# Patient Record
Sex: Female | Born: 1985 | Race: White | Hispanic: No | Marital: Married | Smoking: Never smoker
Health system: Southern US, Community
[De-identification: ages and names within clinical notes are randomized; demographics above are authoritative.]

## PROBLEM LIST (undated history)

## (undated) DIAGNOSIS — R7303 Prediabetes: Secondary | ICD-10-CM

## (undated) DIAGNOSIS — E039 Hypothyroidism, unspecified: Secondary | ICD-10-CM

## (undated) DIAGNOSIS — Z148 Genetic carrier of other disease: Secondary | ICD-10-CM

## (undated) DIAGNOSIS — E282 Polycystic ovarian syndrome: Secondary | ICD-10-CM

## (undated) DIAGNOSIS — J45909 Unspecified asthma, uncomplicated: Secondary | ICD-10-CM

## (undated) HISTORY — DX: Polycystic ovarian syndrome: E28.2

## (undated) HISTORY — PX: WISDOM TOOTH EXTRACTION: SHX21

## (undated) HISTORY — DX: Unspecified asthma, uncomplicated: J45.909

## (undated) HISTORY — DX: Prediabetes: R73.03

---

## 2014-02-26 ENCOUNTER — Other Ambulatory Visit (HOSPITAL_COMMUNITY): Payer: Self-pay

## 2014-02-26 ENCOUNTER — Other Ambulatory Visit (HOSPITAL_COMMUNITY): Payer: Self-pay | Admitting: Maternal and Fetal Medicine

## 2014-02-26 ENCOUNTER — Ambulatory Visit (HOSPITAL_COMMUNITY)
Admission: RE | Admit: 2014-02-26 | Discharge: 2014-02-26 | Disposition: A | Discharge status: Home / Self Care | Payer: PRIVATE HEALTH INSURANCE | Source: Ambulatory Visit | Attending: Obstetrics and Gynecology | Admitting: Obstetrics and Gynecology

## 2014-02-26 ENCOUNTER — Encounter (HOSPITAL_COMMUNITY): Payer: Self-pay

## 2014-02-26 DIAGNOSIS — Z3A12 12 weeks gestation of pregnancy: Secondary | ICD-10-CM

## 2014-02-26 DIAGNOSIS — IMO0002 Reserved for concepts with insufficient information to code with codable children: Secondary | ICD-10-CM

## 2014-02-26 DIAGNOSIS — Z0489 Encounter for examination and observation for other specified reasons: Secondary | ICD-10-CM

## 2014-02-26 DIAGNOSIS — Z315 Encounter for genetic counseling: Secondary | ICD-10-CM | POA: Diagnosis not present

## 2014-02-26 DIAGNOSIS — O289 Unspecified abnormal findings on antenatal screening of mother: Secondary | ICD-10-CM

## 2014-02-26 NOTE — Progress Notes (Signed)
Genetic Counseling  High-Risk Gestation Note  Appointment Date:  02/26/2014 Referred By: Lennox SoldersKulish, Christine E, DO Date of Birth:  [birth year] Partner:  Shari HeritageSteven Gerdeman   Pregnancy History: G1P0 Estimated Date of Delivery: 09/05/14 Estimated Gestational Age: 6458w6d Attending: Particia Nearingecker, Martha, MD  Mrs. Zella BallDeanna Whittington and her husband, Mr. Shari HeritageSteven Ipock, were seen for genetic counseling because of an increased risk for fetal Down syndrome based on Sequential 1 screening.  They were counseled regarding the Sequential 1 (First trimester) screening result and the associated 1 in 15 risk for fetal Down syndrome.  We reviewed chromosomes, nondisjunction, and the common features and variable prognosis of Down syndrome.  In addition, we reviewed the screen adjusted risk for trisomy 7818.  We also discussed other explanations for a screen positive result including: differences in maternal metabolism and normal variation. They understand that this screening is not diagnostic for Down syndrome, but provides a risk assessment.  In addition, we discussed that the maternal serum PAPP-A value (0.23 MoMs) was atypically low for the gestational age.  The were counseled that a low MSPAPP-A is associated with an increased risk for adverse outcomes including: PIH, preeclampsia, poor fetal growth, and preterm delivery. We discussed the recommendation for increased surveillance.   We reviewed available screening options including noninvasive prenatal screening (NIPS)/cell free fetal DNA (cffDNA) testing, and detailed ultrasound.  They were counseled that screening tests are used to modify a patient's a priori risk for aneuploidy, typically based on age. This estimate provides a pregnancy specific risk assessment. We reviewed the benefits and limitations of each option. Specifically, we discussed the conditions for which each test screens, the detection rates, and false positive rates of each. They were also counseled regarding  diagnostic testing via CVS/amniocentesis. We reviewed the associated risks for complications, including spontaneous pregnancy loss.  After consideration of all the options, they elected to proceed with NIPS (Panorama).  Those results will be available in 8-10 days.  The patient also expressed interest in returning for a detailed ultrasound.  This appointment was scheduled today.  Diagnostic testing (CVS/amniocentesis) was declined today.  They understand that screening tests cannot rule out all birth defects or genetic syndromes. The patient was advised of this limitation and states she still does not want additional testing at this time.   Mrs. Flanery was provided with written information regarding cystic fibrosis (CF) including the carrier frequency and incidence in the Caucasian population, the availability of carrier testing and prenatal diagnosis if indicated.  In addition, we discussed that CF is routinely screened for as part of the [place] newborn screening panel.  She declined testing today.   Both family histories were reviewed and found to be contributory for Mrs. Kollmann's father having hereditary hemochromatosis. They were counseled that hemochromatosis, also referred to as HFE-associated hereditary hemochromatosis (HFE-HH), is a common genetic condition that results when a person has gene alterations in both copies of their HFE gene.  This leads to an increased amount of iron absorption and storage of iron in the body including the skin, liver, pancreas, heart, joints and testes.  Symptoms may include lethargy, weight loss, weakness or abdominal pain and may progress to increased skin pigmentation, diabetes mellitus, liver cirrhosis, arthritis or congestive heart failure if untreated.  Symptoms in females are usually milder and occur later in life (post-menopause).  This condition is typically treated via phlebotomy. Variable expressivity and reduced penetrance have been reported, with individuals  who present with only increased transferrin-iron saturation and increased serum ferritin concentration  as well as individuals who are homozygous for C282Y/C282Y who do not have iron overload nor clinical manifestations. The diagnosis of clinical hemochromatosis in individuals is typically based on finding elevated transferrin-iron saturation 45% or higher and serum ferritin concentration above the upper limit of normal and finding two HFE-HH-causing mutations via molecular genetic testing.   We reviewed genes and the autosomal recessive inheritance of hemochromatosis, meaning that each child of a carrier couple has an independent 25% (1 in 4) risk to inherit the condition.  Carriers have no symptoms of the disease and males and females are affected equally. We discussed that all offspring of an individual with hemochromatosis would be obligate carriers. Thus, Mrs. Lelon MastRightmyer would be an obligate carrier of hemochromatosis.  Approximately 1 in 9 Caucasians is a carrier for hemochromatosis. We discussed that genetic testing would not be required for Mrs. Mcgahee, given that she would be expected to be a carrier, given that her father is homozygous. However, given the high carrier frequency, reduced penetrance, genetic testing would be available to determine if she could also be homozygous, which is a less likely scenario.  Mr. Lelon MastRightmyer would have the general population chance to be a carrier of approximately 1 in 1029 given no known family history and no known consanguinity to Mrs. Pytel.  Thus, prior to testing for Mr. Lelon MastRightmyer, the risk of hemochromatosis for the current pregnancy is approximately 1 in 2036, assuming carrier status for Mrs. Vallecillo. We discussed that carrier testing would be available to Mr. Lelon MastRightmyer, if desired, and prenatal diagnosis via amniocentesis would be available for pregnancies of a couple who are both carriers. However, prenatal diagnosis for hemochromatosis is controversial given  the adult-onset nature of the condition as well as the available treatment and reduced penetrance of the condition. Mr. Lelon MastRightmyer declined testing today.  This couple expressed that they are not interested in prenatal diagnosis of hemochromatosis, given the risk for complications associated with the procedure.    Mrs. Pies denied exposure to environmental toxins or chemical agents. She denied the use of alcohol, tobacco or street drugs. She denied significant viral illnesses during the course of her pregnancy. Her medical and surgical histories were contributory for asthma.   I counseled this couple for approximately 43 minutes regarding the above risks and available options.   Donald Prosehristy S. Ladeana Laplant, MS  Certified Genetic Counselor

## 2014-03-05 ENCOUNTER — Telehealth (HOSPITAL_COMMUNITY): Payer: Self-pay | Admitting: Genetics

## 2014-03-05 ENCOUNTER — Other Ambulatory Visit (HOSPITAL_COMMUNITY): Payer: Self-pay

## 2014-03-05 NOTE — Telephone Encounter (Signed)
Called Mariselda Mcconaughey to discuss her cell free fetal DNA test results.  Mrs. Zella BallDeanna Stanfield had Panorama testing through [facility].  Testing was offered because of an abnormal sequential screen part 1.   The patient was identified by name and DOB.  We reviewed that these are within normal limits, showing a less than 1 in 10,000 risk for trisomies 21, 18 and 13, and monosomy X (Turner syndrome).  In addition, the risk for triploidy/vanishing twin and sex chromosome trisomies (47,XXX and 47,XXY) was also low risk.   We reviewed that this testing identifies > 99% of pregnancies with trisomy 8221, trisomy 3813, sex chromosome trisomies (47,XXX and 47,XXY), and triploidy. The detection rate for trisomy 18 is 96%.  The detection rate for monosomy X is ~92%.  The false positive rate is <0.1% for all conditions. Testing was also consistent with female fetal sex.  The patient did not wish to know fetal sex, but may call back later to find out.  She understands that this testing does not identify all genetic conditions.  All questions were answered to her satisfaction, she was encouraged to call with additional questions or concerns.  Azalia Bilisonrad,Jair Lindblad M, MS Certified Genetic Counselor

## 2014-04-02 ENCOUNTER — Other Ambulatory Visit (HOSPITAL_COMMUNITY): Payer: Self-pay | Admitting: Maternal and Fetal Medicine

## 2014-04-02 ENCOUNTER — Ambulatory Visit (HOSPITAL_COMMUNITY)
Admission: RE | Admit: 2014-04-02 | Discharge: 2014-04-02 | Disposition: A | Discharge status: Home / Self Care | Payer: PRIVATE HEALTH INSURANCE | Source: Ambulatory Visit | Attending: Maternal and Fetal Medicine | Admitting: Maternal and Fetal Medicine

## 2014-04-02 ENCOUNTER — Encounter (HOSPITAL_COMMUNITY): Payer: Self-pay

## 2014-04-02 DIAGNOSIS — O289 Unspecified abnormal findings on antenatal screening of mother: Secondary | ICD-10-CM | POA: Insufficient documentation

## 2014-04-02 DIAGNOSIS — IMO0002 Reserved for concepts with insufficient information to code with codable children: Secondary | ICD-10-CM

## 2014-04-02 DIAGNOSIS — E039 Hypothyroidism, unspecified: Secondary | ICD-10-CM | POA: Insufficient documentation

## 2014-04-02 DIAGNOSIS — Z36 Encounter for antenatal screening of mother: Secondary | ICD-10-CM | POA: Diagnosis not present

## 2014-04-02 DIAGNOSIS — Z3689 Encounter for other specified antenatal screening: Secondary | ICD-10-CM | POA: Insufficient documentation

## 2014-04-02 DIAGNOSIS — Z0489 Encounter for examination and observation for other specified reasons: Secondary | ICD-10-CM

## 2014-04-02 DIAGNOSIS — O99282 Endocrine, nutritional and metabolic diseases complicating pregnancy, second trimester: Secondary | ICD-10-CM | POA: Diagnosis not present

## 2014-04-02 DIAGNOSIS — Z3A17 17 weeks gestation of pregnancy: Secondary | ICD-10-CM | POA: Insufficient documentation

## 2014-04-02 HISTORY — DX: Genetic carrier of other disease: Z14.8

## 2014-04-02 HISTORY — DX: Hypothyroidism, unspecified: E03.9

## 2014-05-14 ENCOUNTER — Ambulatory Visit (HOSPITAL_COMMUNITY)
Admission: RE | Admit: 2014-05-14 | Discharge: 2014-05-14 | Disposition: A | Discharge status: Home / Self Care | Payer: PRIVATE HEALTH INSURANCE | Source: Ambulatory Visit | Attending: Obstetrics and Gynecology | Admitting: Obstetrics and Gynecology

## 2014-05-14 ENCOUNTER — Encounter (HOSPITAL_COMMUNITY): Payer: Self-pay

## 2014-05-14 DIAGNOSIS — O283 Abnormal ultrasonic finding on antenatal screening of mother: Secondary | ICD-10-CM | POA: Insufficient documentation

## 2014-05-14 DIAGNOSIS — Z3A23 23 weeks gestation of pregnancy: Secondary | ICD-10-CM | POA: Insufficient documentation

## 2014-05-14 DIAGNOSIS — O289 Unspecified abnormal findings on antenatal screening of mother: Secondary | ICD-10-CM

## 2014-05-14 DIAGNOSIS — Z3A00 Weeks of gestation of pregnancy not specified: Secondary | ICD-10-CM | POA: Diagnosis not present

## 2014-06-25 ENCOUNTER — Encounter (HOSPITAL_COMMUNITY): Payer: Self-pay

## 2014-06-25 ENCOUNTER — Ambulatory Visit (HOSPITAL_COMMUNITY)
Admission: RE | Admit: 2014-06-25 | Discharge: 2014-06-25 | Disposition: A | Discharge status: Home / Self Care | Payer: PRIVATE HEALTH INSURANCE | Source: Ambulatory Visit | Attending: Obstetrics and Gynecology | Admitting: Obstetrics and Gynecology

## 2014-06-25 DIAGNOSIS — O283 Abnormal ultrasonic finding on antenatal screening of mother: Secondary | ICD-10-CM | POA: Diagnosis not present

## 2014-06-25 DIAGNOSIS — O24410 Gestational diabetes mellitus in pregnancy, diet controlled: Secondary | ICD-10-CM | POA: Diagnosis not present

## 2014-06-25 DIAGNOSIS — O352XX0 Maternal care for (suspected) hereditary disease in fetus, not applicable or unspecified: Secondary | ICD-10-CM | POA: Insufficient documentation

## 2014-06-25 DIAGNOSIS — Z3A29 29 weeks gestation of pregnancy: Secondary | ICD-10-CM | POA: Insufficient documentation

## 2014-06-25 DIAGNOSIS — O289 Unspecified abnormal findings on antenatal screening of mother: Secondary | ICD-10-CM

## 2014-07-04 ENCOUNTER — Other Ambulatory Visit (HOSPITAL_COMMUNITY): Payer: Self-pay | Admitting: Maternal and Fetal Medicine

## 2014-07-04 DIAGNOSIS — O24410 Gestational diabetes mellitus in pregnancy, diet controlled: Secondary | ICD-10-CM

## 2014-07-04 DIAGNOSIS — Z8349 Family history of other endocrine, nutritional and metabolic diseases: Secondary | ICD-10-CM

## 2014-07-04 DIAGNOSIS — O99213 Obesity complicating pregnancy, third trimester: Secondary | ICD-10-CM

## 2014-07-04 DIAGNOSIS — O289 Unspecified abnormal findings on antenatal screening of mother: Secondary | ICD-10-CM

## 2014-07-04 DIAGNOSIS — N9489 Other specified conditions associated with female genital organs and menstrual cycle: Secondary | ICD-10-CM

## 2014-07-24 ENCOUNTER — Other Ambulatory Visit (HOSPITAL_COMMUNITY): Payer: Self-pay | Admitting: Maternal and Fetal Medicine

## 2014-07-24 ENCOUNTER — Ambulatory Visit (HOSPITAL_COMMUNITY)
Admission: RE | Admit: 2014-07-24 | Discharge: 2014-07-24 | Disposition: A | Discharge status: Home / Self Care | Payer: PRIVATE HEALTH INSURANCE | Source: Ambulatory Visit | Attending: Obstetrics and Gynecology | Admitting: Obstetrics and Gynecology

## 2014-07-24 ENCOUNTER — Encounter (HOSPITAL_COMMUNITY): Payer: Self-pay

## 2014-07-24 DIAGNOSIS — O352XX0 Maternal care for (suspected) hereditary disease in fetus, not applicable or unspecified: Secondary | ICD-10-CM | POA: Diagnosis not present

## 2014-07-24 DIAGNOSIS — O283 Abnormal ultrasonic finding on antenatal screening of mother: Secondary | ICD-10-CM | POA: Diagnosis not present

## 2014-07-24 DIAGNOSIS — O24410 Gestational diabetes mellitus in pregnancy, diet controlled: Secondary | ICD-10-CM

## 2014-07-24 DIAGNOSIS — O289 Unspecified abnormal findings on antenatal screening of mother: Secondary | ICD-10-CM

## 2014-07-24 DIAGNOSIS — Z3A33 33 weeks gestation of pregnancy: Secondary | ICD-10-CM | POA: Insufficient documentation

## 2014-07-24 DIAGNOSIS — N9489 Other specified conditions associated with female genital organs and menstrual cycle: Secondary | ICD-10-CM

## 2014-07-24 DIAGNOSIS — O99213 Obesity complicating pregnancy, third trimester: Secondary | ICD-10-CM

## 2014-07-24 DIAGNOSIS — Z8349 Family history of other endocrine, nutritional and metabolic diseases: Secondary | ICD-10-CM

## 2014-08-21 ENCOUNTER — Encounter (HOSPITAL_COMMUNITY): Payer: Self-pay

## 2014-08-21 ENCOUNTER — Ambulatory Visit (HOSPITAL_COMMUNITY)
Admission: RE | Admit: 2014-08-21 | Discharge: 2014-08-21 | Disposition: A | Discharge status: Home / Self Care | Payer: PRIVATE HEALTH INSURANCE | Source: Ambulatory Visit | Attending: Maternal and Fetal Medicine | Admitting: Maternal and Fetal Medicine

## 2014-08-21 DIAGNOSIS — O99213 Obesity complicating pregnancy, third trimester: Secondary | ICD-10-CM

## 2014-08-21 DIAGNOSIS — O289 Unspecified abnormal findings on antenatal screening of mother: Secondary | ICD-10-CM

## 2014-08-21 DIAGNOSIS — O283 Abnormal ultrasonic finding on antenatal screening of mother: Secondary | ICD-10-CM | POA: Insufficient documentation

## 2014-08-21 DIAGNOSIS — O3660X0 Maternal care for excessive fetal growth, unspecified trimester, not applicable or unspecified: Secondary | ICD-10-CM | POA: Insufficient documentation

## 2014-08-21 DIAGNOSIS — Z3A37 37 weeks gestation of pregnancy: Secondary | ICD-10-CM | POA: Insufficient documentation

## 2014-08-21 DIAGNOSIS — O352XX0 Maternal care for (suspected) hereditary disease in fetus, not applicable or unspecified: Secondary | ICD-10-CM | POA: Insufficient documentation

## 2014-08-21 DIAGNOSIS — O24410 Gestational diabetes mellitus in pregnancy, diet controlled: Secondary | ICD-10-CM | POA: Diagnosis not present

## 2014-08-21 DIAGNOSIS — N9489 Other specified conditions associated with female genital organs and menstrual cycle: Secondary | ICD-10-CM

## 2014-08-21 DIAGNOSIS — Z8349 Family history of other endocrine, nutritional and metabolic diseases: Secondary | ICD-10-CM

## 2014-08-27 ENCOUNTER — Other Ambulatory Visit: Payer: Self-pay

## 2015-01-01 ENCOUNTER — Encounter (HOSPITAL_COMMUNITY): Payer: Self-pay | Admitting: *Deleted

## 2015-03-14 IMAGING — US US OB FOLLOW-UP
1 series · 12 of 28 positions shown · non-contrast
Comparison: none

[Series 1: us ob follow-up · 0.23mm/px · 12 of 70 slices shown]
[im 3/70]
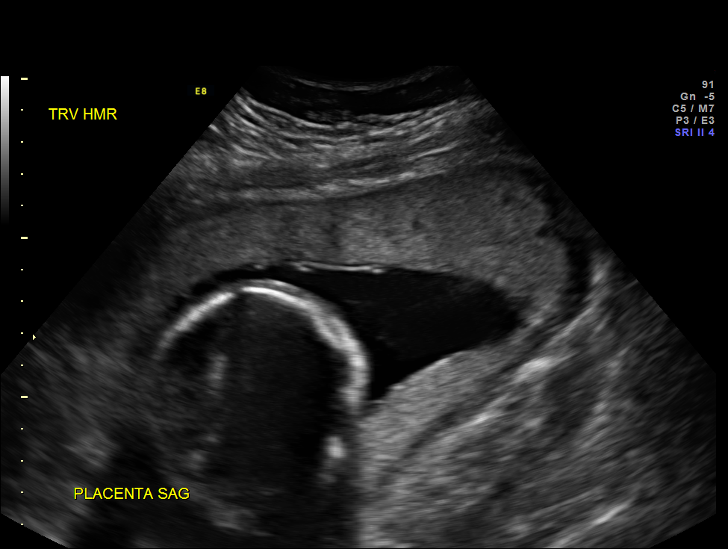
[im 8/70]
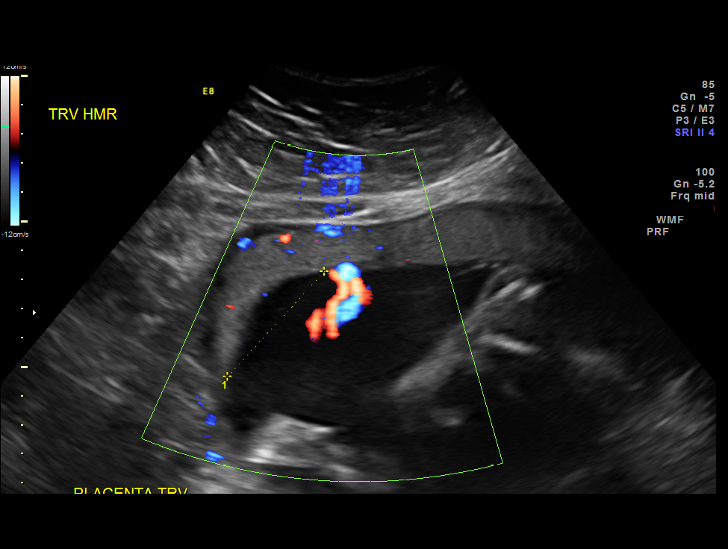
[im 13/70]
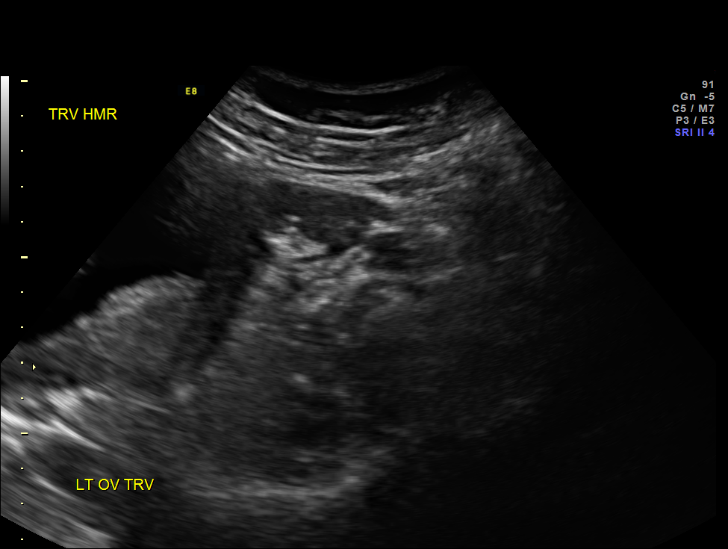
[im 21/70]
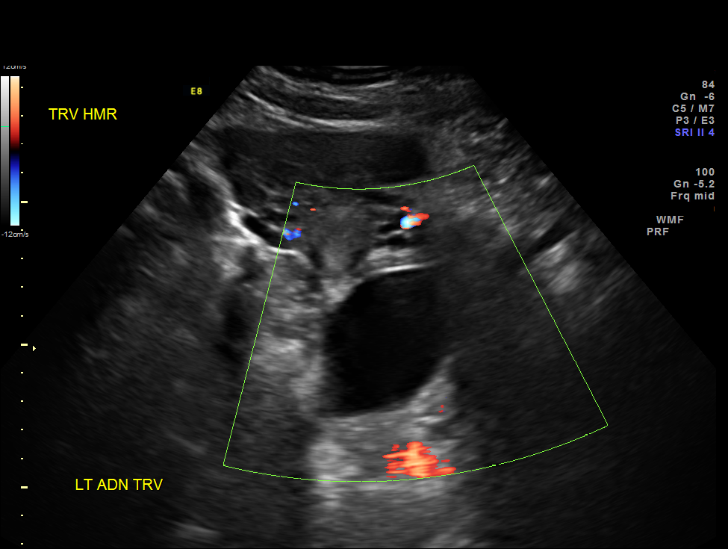
[im 26/70]
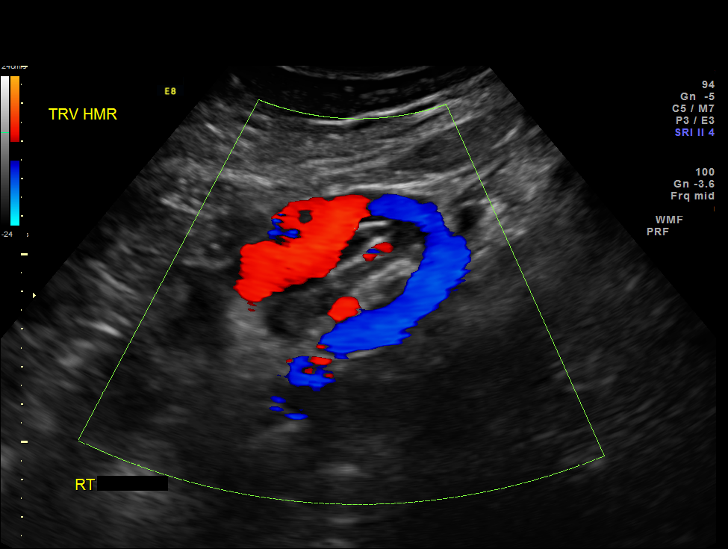
[im 31/70]
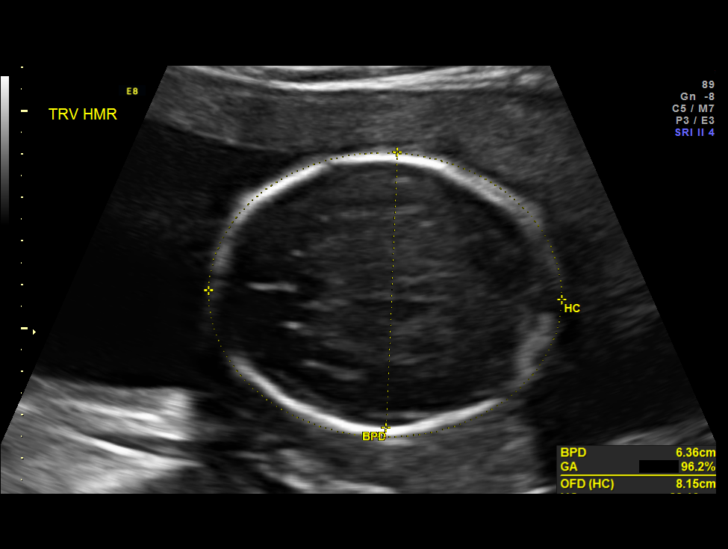
[im 39/70]
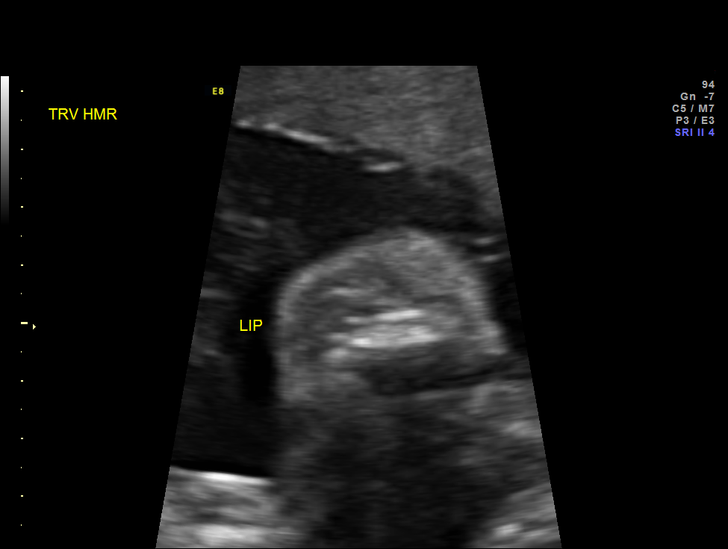
[im 44/70]
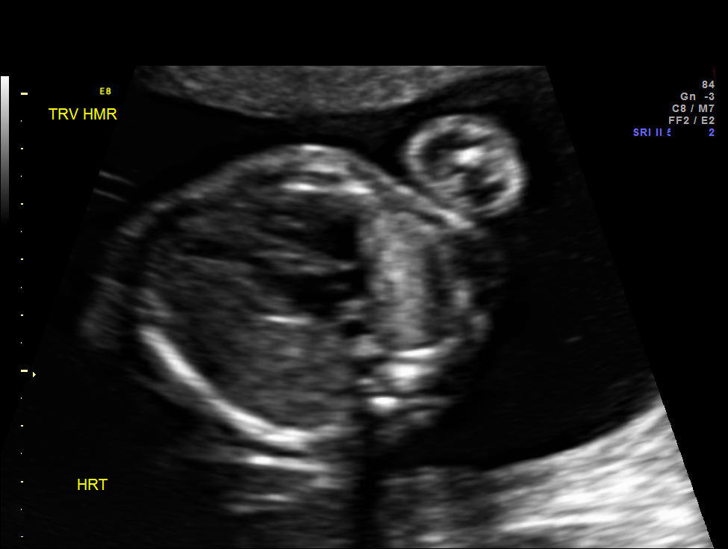
[im 49/70]
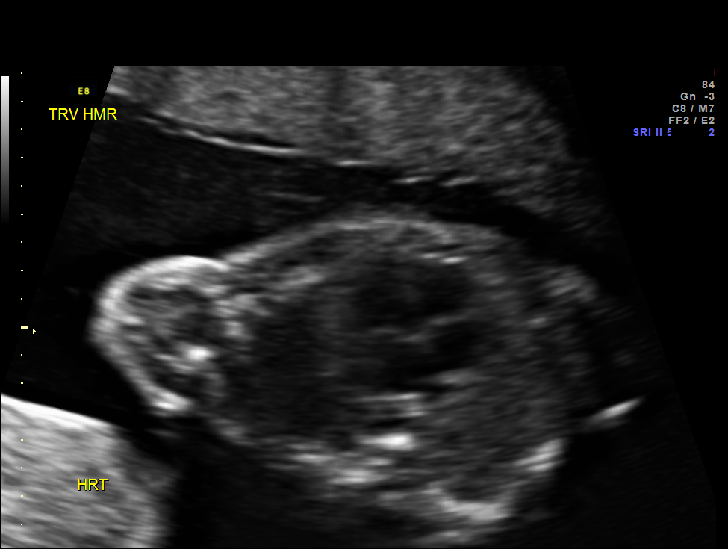
[im 57/70]
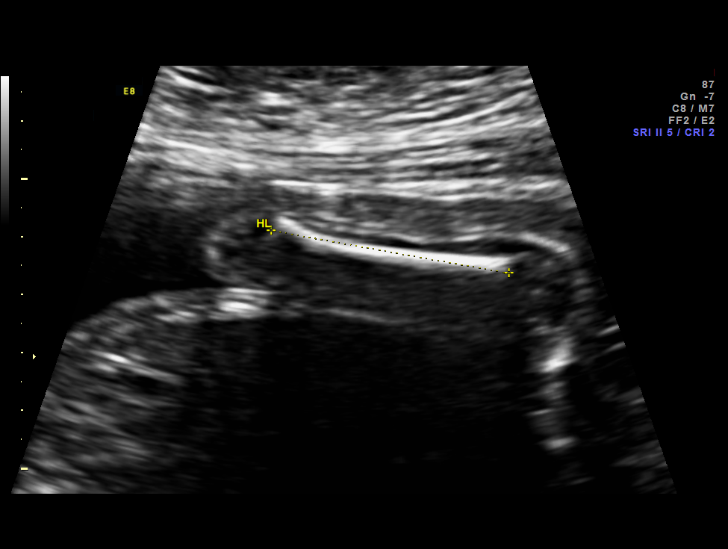
[im 62/70]
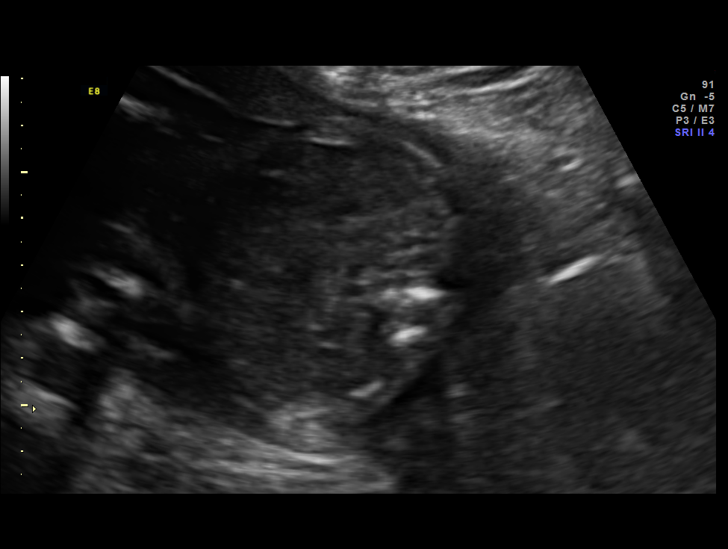
[im 67/70]
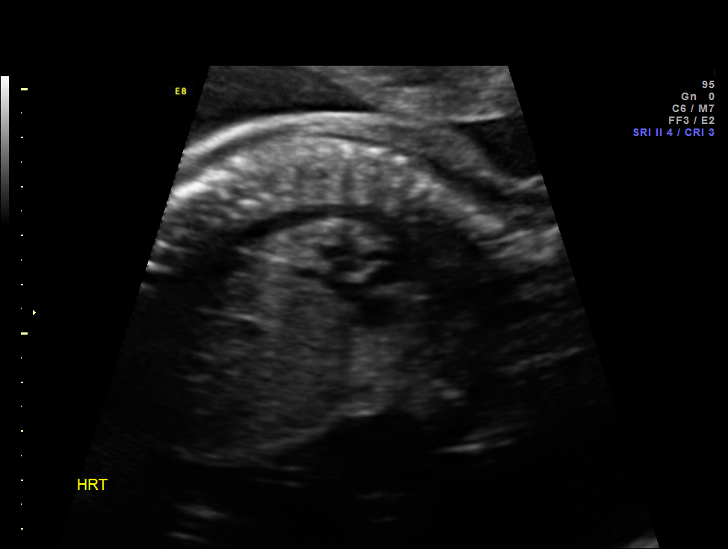

[12 of 28 positions shown; findings below may reference images not displayed]

OBSTETRICS REPORT
                      (Signed Final 05/14/2014 [DATE])

Service(s) Provided

 US OB FOLLOW UP                                       76816.1
Indications

 23 weeks gestation of pregnancy
 Abnormal first trimester (sequential) screen ([facility]
 [DATE]) - low risk NIPS
 Abnormal finding on antenatal screening (low
 NAHOM 0.23 MoM)
 History of genetic / anatomic abnormality -
 hemochromatosis (pt's father)
 Adnexal mass, complex or unspecified
 Obesity complicating pregnancy, second trimester
Fetal Evaluation

 Num Of Fetuses:    1
 Fetal Heart Rate:  155                          bpm
 Cardiac Activity:  Observed
 Presentation:      Variable
 Placenta:          Anterior right, above
                    cervical os
 P. Cord            Previously Visualized
 Insertion:

 Amniotic Fluid
 AFI FV:      Subjectively within normal limits
                                             Larg Pckt:     5.6  cm
Biometry

 BPD:     63.2  mm     G. Age:  25w 4d                CI:         77.0   70 - 86
 OFD:     82.1  mm                                    FL/HC:      19.1   18.7 -

 HC:     232.6  mm     G. Age:  25w 2d       87  %    HC/AC:      1.13   1.05 -

 AC:     205.9  mm     G. Age:  25w 1d       83  %    FL/BPD:     70.4   71 - 87
 FL:      44.5  mm     G. Age:  24w 5d       69  %    FL/AC:      21.6   20 - 24
 HUM:     41.8  mm     G. Age:  25w 2d       77  %
 CER:     27.1  mm     G. Age:  24w 4d       67  %

 Est. FW:     761  gm    1 lb 11 oz      75  %
Gestational Age
 LMP:           23w 5d        Date:  11/29/13                 EDD:   09/05/14
 U/S Today:     25w 1d                                        EDD:   08/26/14
 Best:          23w 5d     Det. By:  LMP  (11/29/13)          EDD:   09/05/14
Anatomy

 Cranium:          Appears normal         Aortic Arch:      Appears normal
 Fetal Cavum:      Appears normal         Ductal Arch:      Appears normal
 Ventricles:       Appears normal         Diaphragm:        Appears normal
 Choroid Plexus:   Previously seen        Stomach:          Appears normal, left
                                                            sided
 Cerebellum:       Appears normal         Abdomen:          Appears normal
 Posterior Fossa:  Appears normal         Abdominal Wall:   Previously seen
 Nuchal Fold:      Previously seen        Cord Vessels:     Previously seen
 Face:             Appears normal         Kidneys:          Appear normal
                   (orbits and profile)
 Lips:             Appears normal         Bladder:          Appears normal
 Heart:            Appears normal         Spine:            Previously seen
                   (4CH, axis, and
                   situs)
 RVOT:             Appears normal         Lower             Previously seen
                                          Extremities:
 LVOT:             Appears normal         Upper             Previously seen
                                          Extremities:

 Other:  Fetus appears to be a male. Heels and 5th digit previously visualized.
         Nasal bone visualized. Technically difficult due to maternal habitus.
Targeted Anatomy

 Fetal Central Nervous System
 Cisterna Magna:
Cervix Uterus Adnexa

 Cervical Length:    4.6      cm

 Cervix:       Normal appearance by transabdominal scan.

 Left Ovary:    Within normal limits.
 Right Ovary:   Within normal limits.
 Adnexa:     Left adnexa cyst 7.4 x 4.1 x 4.9 cm
Comments

 Ms. Jarosz returns for follow up.  See previous note from
 Genetics counselor.  Ms. Jarosz reports a family history of
 Hemochromatosis and that her baseline LFTs were mildly
 elevated and have recently worsened.  24-hr urine protein is
 normal and she does not report s/sx of preeclampsia
Impression

 Single IUP at 23w 5d
 Increased DSR by first trimester screen - low risk NIPS;
 NAHOM 0.2 MoM
 Normal interval anatomy- the anatomic survey is now
 complete
 Interval growth is appropriate (75th %tile)
 Normal amniotic fluid volume

 Of note, a 7.4 x 4.1 x 4.9 cm left simple adenxal cyst is noted.
 It appears to be separate from the ovary (? paratubal cyst)
Recommendations

 Recommend follow-up ultrasound examination in 6 weeks for
 growth due to [place].  Will reevaluate the left adenxal
 cyst at that time.
 Consider referral to GI for evaluation of abnormal LFTs (?
 hemochromatosis). Preeclampsia is unlikely given clinical
 scenario.

## 2017-12-06 DIAGNOSIS — Z3A34 34 weeks gestation of pregnancy: Secondary | ICD-10-CM | POA: Diagnosis not present

## 2017-12-06 DIAGNOSIS — O24415 Gestational diabetes mellitus in pregnancy, controlled by oral hypoglycemic drugs: Secondary | ICD-10-CM | POA: Diagnosis not present

## 2017-12-13 DIAGNOSIS — Z3A35 35 weeks gestation of pregnancy: Secondary | ICD-10-CM | POA: Diagnosis not present

## 2017-12-13 DIAGNOSIS — O24415 Gestational diabetes mellitus in pregnancy, controlled by oral hypoglycemic drugs: Secondary | ICD-10-CM | POA: Diagnosis not present

## 2017-12-20 DIAGNOSIS — O368130 Decreased fetal movements, third trimester, not applicable or unspecified: Secondary | ICD-10-CM | POA: Diagnosis not present

## 2017-12-20 DIAGNOSIS — Z3A36 36 weeks gestation of pregnancy: Secondary | ICD-10-CM | POA: Diagnosis not present

## 2017-12-20 DIAGNOSIS — O24415 Gestational diabetes mellitus in pregnancy, controlled by oral hypoglycemic drugs: Secondary | ICD-10-CM | POA: Diagnosis not present

## 2017-12-22 DIAGNOSIS — O99283 Endocrine, nutritional and metabolic diseases complicating pregnancy, third trimester: Secondary | ICD-10-CM | POA: Diagnosis not present

## 2017-12-22 DIAGNOSIS — Z3A37 37 weeks gestation of pregnancy: Secondary | ICD-10-CM | POA: Diagnosis not present

## 2017-12-22 DIAGNOSIS — O24415 Gestational diabetes mellitus in pregnancy, controlled by oral hypoglycemic drugs: Secondary | ICD-10-CM | POA: Diagnosis not present

## 2017-12-29 DIAGNOSIS — Z3A38 38 weeks gestation of pregnancy: Secondary | ICD-10-CM | POA: Diagnosis not present

## 2017-12-29 DIAGNOSIS — O24415 Gestational diabetes mellitus in pregnancy, controlled by oral hypoglycemic drugs: Secondary | ICD-10-CM | POA: Diagnosis not present

## 2018-01-02 DIAGNOSIS — Z3A38 38 weeks gestation of pregnancy: Secondary | ICD-10-CM | POA: Diagnosis not present

## 2018-01-02 DIAGNOSIS — O24415 Gestational diabetes mellitus in pregnancy, controlled by oral hypoglycemic drugs: Secondary | ICD-10-CM | POA: Diagnosis not present

## 2018-01-04 DIAGNOSIS — Z3A39 39 weeks gestation of pregnancy: Secondary | ICD-10-CM | POA: Diagnosis not present

## 2018-01-04 DIAGNOSIS — O34211 Maternal care for low transverse scar from previous cesarean delivery: Secondary | ICD-10-CM | POA: Diagnosis not present

## 2018-01-04 DIAGNOSIS — O321XX0 Maternal care for breech presentation, not applicable or unspecified: Secondary | ICD-10-CM | POA: Diagnosis not present

## 2018-01-04 DIAGNOSIS — N858 Other specified noninflammatory disorders of uterus: Secondary | ICD-10-CM | POA: Diagnosis not present

## 2018-02-11 DIAGNOSIS — J111 Influenza due to unidentified influenza virus with other respiratory manifestations: Secondary | ICD-10-CM | POA: Diagnosis not present

## 2018-02-21 DIAGNOSIS — Z131 Encounter for screening for diabetes mellitus: Secondary | ICD-10-CM | POA: Diagnosis not present

## 2018-02-21 DIAGNOSIS — Z8632 Personal history of gestational diabetes: Secondary | ICD-10-CM | POA: Diagnosis not present

## 2018-05-02 DIAGNOSIS — J019 Acute sinusitis, unspecified: Secondary | ICD-10-CM | POA: Diagnosis not present

## 2018-05-02 DIAGNOSIS — R509 Fever, unspecified: Secondary | ICD-10-CM | POA: Diagnosis not present

## 2018-05-02 DIAGNOSIS — J209 Acute bronchitis, unspecified: Secondary | ICD-10-CM | POA: Diagnosis not present

## 2018-07-26 DIAGNOSIS — R05 Cough: Secondary | ICD-10-CM | POA: Diagnosis not present

## 2019-06-14 ENCOUNTER — Ambulatory Visit: Payer: Self-pay | Admitting: Allergy

## 2019-07-11 NOTE — Progress Notes (Signed)
New Patient Note  RE: Melinda Mccarthy MRN: [id number] DOB: 10-05-1985 Date of Office Visit: 07/12/2019  Referring provider: Paulina Fusi, MD Primary care provider: Konrad Felix, MD (Inactive)  Chief Complaint: Asthma, Allergic Rhinitis  (sneezing,coughing,watery eyes), and Food Intolerance (blisters in mouth after eating apples and peaches)  History of Present Illness: I had the pleasure of seeing Melinda Mccarthy for initial evaluation [facility]  on 07/12/2019. She is a 34 y.o. female, who is referred here by PCP for the evaluation of allergies and asthma.  Rhinitis:  She reports symptoms of sneezing, coughing, itchy/watery eyes, PND, nasal congestion. Symptoms have been going on for 20 years. The symptoms are present all year around with worsening in spring and fall. Other triggers include exposure to no. Anosmia: no. Headache: yes. She has used zyrtec, Claritin, allegra, Singulair,Flonase BID and azelastine 1 spray QHS with some improvement in symptoms. Sinus infections: yes. Previous work up includes: skin testing in the past showed multiple positives per patient report and was on allergy injections for about 1 year with unknown benefit. Previous ENT evaluation: no. Previous sinus imaging: no. History of nasal polyps: no. Last eye exam: 10-15 years ago.  Asthma:  Patient had a URI requiring antibiotics (Augmentin) in summer of 2020 which helped.  Since then she had a lingering cough with some white/yellow phlegm. Occasional wheezing. Patient was diagnosed with asthma since age 60.  Current medications include Symbicort 160 2 puffs twice a day x 1 yr and albuterol prn which help. She reports not using aerochamber with inhalers. She tried the following inhalers: Advair, Pulmicort. Main triggers are unknown. In the last month, frequency of symptoms: daily coughing. Frequency of nocturnal symptoms: 0x/month. Frequency of SABA use: <1x/week. Interference with  physical activity: no. Sleep is undisturbed. In the last 12 months, emergency room visits/urgent care visits/doctor office visits or hospitalizations due to respiratory issues: 1. In the last 12 months, oral steroids courses: 1. Lifetime history of hospitalization for respiratory issues: no. Prior intubations: no. Asthma was diagnosed at age 42. History of pneumonia: yes a few years ago. She was evaluated by allergist in the past. Smoking exposure: no. Up to date with flu vaccine: yes.  History of reflux: occasional and takes TUMS.  Chest X-ray a few months ago was normal per patient report.  Last course of antibiotics was 3-4 month ago.   Patient had COVID-19 in December and had 2 weeks of prednisone at that time.   Food: Noted that when she eats fresh apples and fresh peaches she gets pruritis throat, blistering inside mouth and tongue swelling. Symptoms usually resolve with 30 minutes after Benadryl.  Past work up includes: None.  Dietary History: patient has been eating other foods including milk, eggs, peanut, treenuts, sesame, shellfish, seafood, soy, wheat, meats, fruits and vegetables.  Vaccine: Patient had first COVID-19 pfizer on 05/10/19 and within 20 minutes noted tongue swelling. No associated rash or itching. Patient took Benadryl and symptoms resolved. The following day noted a node on the right side. This is where she had her injection.   Any known reactions to polyethylene glycol or polysorbate?  No   Any history of anaphylaxis to vaccinations? No  Any history of reactions to injectable medications? No  Any history of anaphylaxis to colonoscopy preps (i.e.Miralax)? No prior exposure to Miralax or colonoscopy.   Assessment and Plan: Melinda Mccarthy is a 34 y.o. female with: Other allergic rhinitis Perennial rhinoconjunctivitis symptoms for 20+ years with worsening  in the spring and fall.  Patient had skin testing in the past which showed multiple positives.  Records not available  for review.  Patient was on allergy immunotherapy for 1 year with unknown benefit.  No prior ENT evaluation.  Tried Zyrtec, Claritin, Allegra, Singulair, Flonase and azelastine with some benefit.  Today's skin testing showed:Positive to grass, weed, ragweed, trees, dust mites, cat, dog. Results given.    Start environmental control measures.   Try Xhance 2 spray twice a day. This replaces fluticasone. Sample given and demonstrated proper use.  May use azelastine 1-2 sprays per nostril twice a day as needed for drainage.  Nasal saline spray (i.e., Simply Saline) or nasal saline lavage (i.e., NeilMed) is recommended as needed and prior to medicated nasal sprays.  May use olopatadine eye drops 0.2% once a day as needed for itchy/watery eyes.  May take Xyzal daily as needed for allergies.  Discussed if above regimen does not control symptoms then will start allergy immunotherapy.  Read about allergy injections.   Allergic conjunctivitis of both eyes  See assessment and plan as above for allergic rhinitis.  Moderate persistent asthma without complication Patient was diagnosed with asthma since childhood and currently on Symbicort 160 2 puffs twice a day for 1 year and albuterol as needed.  Patient had an upper respiratory infection in the summer 2020 which was treated with Augmentin however since then she has been having a lingering cough with some whitish-yellow phlegm at times.  Normal chest x-ray a few months ago per patient report.  Last antibiotic course was 3 to 4 months ago with no benefit.  Had 2 courses of prednisone in December as well.  Today's spirometry was normal with no improvement in FEV1 post bronchodilator treatment.  Clinically feeling the same. . Daily controller medication(s): continue Symbicort 160 2 puffs twice a day with spacer and rinse mouth afterwards. . Prior to physical activity: May use albuterol rescue inhaler 2 puffs 5 to 15 minutes prior to strenuous physical  activities. Marland Kitchen Rescue medications: May use albuterol rescue inhaler 2 puffs or nebulizer every 4 to 6 hours as needed for shortness of breath, chest tightness, coughing, and wheezing. Monitor frequency of use.   Chronic sinusitis Discussed with patient that I am concerned if her coughing and sinus pressure may be from a chronic sinusitis.  No recent sinus imaging.  Ordered CT sinus and depending on results will adjust medical therapy.  Pollen-food allergy Fresh apples and fresh peaches cause perioral pruritus and blistering.  Symptoms usually resolve within 30 minutes after Benadryl.  Today's skin prick testing was negative to apples and peaches.  Continue to avoid fresh apples and fresh peaches.   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 This is caused by cross reactivity of pollen with fresh fruits and vegetables, and nuts. Symptoms are usually localized in the form of itching and burning in mouth and throat. Very rarely it can progress to more severe symptoms. Eating foods in cooked or processed forms usually minimizes symptoms. I recommended avoidance of eating the problem foods, especially during the peak season(s). Sometimes, OFAS can induce severe throat swelling or even a systemic reaction; with such instance, I advised them to report to a local ER. A list of common pollens and food cross-reactivities was provided to the patient.   Vaccine counseling Patient had COVID-19 in December 2020 and received her first Pfizer vaccine on 05/10/2019.  She had  some tongue swelling 20 minutes afterwards and noted an enlarged node on the right side which is the side she had her injection.  Patient has no history of reactions to vaccines, injectable medications, polyethylene glycol, polysorbate, and previous exposure to MiraLAX or colonoscopy prep.  Discussed with patient that given her clinical symptoms will recommend COVID-19  component testing in our office - schedule this once the coughing is improved.   Must be off antihistamines for 3-5 days.  Plan on being here for 3-4 hours.  Hold covid-19 vaccines for now.   Regarding the lymph node if is not improving or getting worse advised her to follow-up with her PCP.  Return in about 2 months (around 09/11/2019).  Meds ordered this encounter  Medications  . Olopatadine HCl 0.2 % SOLN    Sig: Place 1 drop into both eyes daily as needed.    Dispense:  2.5 mL    Refill:  5  . Fluticasone Propionate (XHANCE) 93 MCG/ACT EXHU    Sig: Place 2 sprays into both nostrils 2 (two) times daily.    Dispense:  32 mL    Refill:  5   Other allergy screening: Medication allergy: yes  Sulfa - tongue swelling and pruritus Hymenoptera allergy: no Urticaria: no Eczema:yes History of recurrent infections suggestive of immunodeficency: no  Diagnostics: Spirometry:  Tracings reviewed. Her effort: Good reproducible efforts. FVC: 3.94L FEV1: 3.43L, 94% predicted FEV1/FVC ratio: 87% Interpretation: Spirometry consistent with normal pattern with no improvement in FEV1 post bronchodilator treatment. Clinically feeling the same.  Please see scanned spirometry results for details.  Skin Testing: Environmental allergy panel and select foods. Positive to grass, weed, ragweed, trees, dust mites, cat, dog. Negative test to: apples, peaches.  Results discussed with patient/family. Airborne Adult Perc - 07/12/19 0912    Time Antigen Placed  0912    Allergen Manufacturer  Waynette Buttery    Location  Back    Number of Test  59    1. Control-Buffer 50% Glycerol  Negative    2. Control-Histamine 1 mg/ml  2+    3. Albumin saline  Negative    4. Bahia  3+    5. [place]  3+    6. Johnson  3+    7. Kentucky Blue  3+    8. Meadow Fescue  3+    9. Perennial Rye  3+    10. Sweet Vernal  Negative    11. Timothy  3+    12. Cocklebur  Negative    13. Burweed Marshelder  Negative    14.  Ragweed, short  Negative    15. Ragweed, Giant  Negative    16. Plantain,  English  Negative    17. Lamb's Quarters  Negative    18. Sheep Sorrell  Negative    19. Rough Pigweed  Negative    20. Marsh Elder, Rough  Negative    21. Mugwort, Common  Negative    22. Ash mix  4+    23. Birch mix  4+    24. Beech American  3+    25. Box, Elder  --   +/-   26. Cedar, red  Negative    27. Cottonwood, [place]issau  --   +/-   28. Elm mix  --   +/-   29. Hickory  3+    30. Maple mix  Negative    31. Oak, Guinea[place]au mix  4+    32. Pecan Pollen  2+  33. Pine mix  Negative    34. Sycamore [place]  --   +/-   35. Elgin, Black Pollen  3+    36. Alternaria alternata  Negative    37. Cladosporium Herbarum  Negative    38. Aspergillus mix  Negative    39. Penicillium mix  Negative    40. Bipolaris sorokiniana (Helminthosporium)  Negative    41. Drechslera spicifera (Curvularia)  Negative    42. Mucor plumbeus  Negative    43. Fusarium moniliforme  Negative    44. Aureobasidium pullulans (pullulara)  Negative    45. Rhizopus oryzae  Negative    46. Botrytis cinera  Negative    47. Epicoccum nigrum  --   +/-   48. Phoma betae  Negative    49. Candida Albicans  Negative    50. Trichophyton mentagrophytes  Negative    51. Mite, D Farinae  5,000 AU/ml  4+    52. Mite, D Pteronyssinus  5,000 AU/ml  4+    53. Cat Hair 10,000 BAU/ml  4+    54.  Dog Epithelia  Negative    55. Mixed Feathers  Negative    56. Horse Epithelia  Negative    57. Cockroach, German  Negative    58. Mouse  Negative    59. Tobacco Leaf  Negative     Intradermal - 07/12/19 0939    Time Antigen Placed  0930    Allergen Manufacturer  Lavella Hammock    Location  Arm    Number of Test  9    Intradermal  Select    Control  Negative    Ragweed mix  2+    Weed mix  2+    Mold 1  Negative    Mold 2  Negative    Mold 3  Negative    Mold 4  Negative    Dog  3+    Cockroach  Negative     Food Adult Perc - 07/12/19 0900     Time Antigen Placed  2831    Allergen Manufacturer  Lavella Hammock    Location  Back    Number of allergen test  2    58. Apple  Negative    59. Peach  Negative       Past Medical History: Patient Active Problem List   Diagnosis Date Noted  . Other allergic rhinitis 07/12/2019  . Allergic conjunctivitis of both eyes 07/12/2019  . Moderate persistent asthma without complication 51/76/1607  . Pollen-food allergy 07/12/2019  . Chronic sinusitis 07/12/2019  . Vaccine counseling 07/12/2019  . [redacted] weeks gestation of pregnancy   . Excessive fetal growth affecting management of mother, antepartum   . Diet controlled gestational diabetes mellitus in third trimester   . [redacted] weeks gestation of pregnancy   . Abnormal first trimester screen   . Encounter for fetal anatomic survey   . [redacted] weeks gestation of pregnancy   . Abnormal findings on antenatal screening of mother 02/26/2014  . [redacted] weeks gestation of pregnancy 02/26/2014   Past Medical History:  Diagnosis Date  . Asthma   . Hemochromatosis carrier   . Hypothyroid   . PCOS (polycystic ovarian syndrome)   . Pre-diabetes    Past Surgical History: Past Surgical History:  Procedure Laterality Date  . CESAREAN SECTION  2016  . CESAREAN SECTION  2019  . WISDOM TOOTH EXTRACTION     Medication List:  Current Outpatient Medications  Medication Sig Dispense Refill  .  albuterol (PROVENTIL) (5 MG/ML) 0.5% nebulizer solution Take 2.5 mg by nebulization every 6 (six) hours as needed for wheezing or shortness of breath.    Marland Kitchen albuterol (VENTOLIN HFA) 108 (90 Base) MCG/ACT inhaler Inhale into the lungs every 6 (six) hours as needed for wheezing or shortness of breath.    . Budesonide-Formoterol Fumarate (SYMBICORT IN) Inhale into the lungs.    . Cetirizine HCl (ZYRTEC ALLERGY PO) Take by mouth.    . Levothyroxine Sodium (SYNTHROID PO) Take by mouth.    . metFORMIN (GLUCOPHAGE) 500 MG tablet Take by mouth 2 (two) times daily with a meal.    .  Fluticasone Propionate (XHANCE) 93 MCG/ACT EXHU Place 2 sprays into both nostrils 2 (two) times daily. 32 mL 5  . Olopatadine HCl 0.2 % SOLN Place 1 drop into both eyes daily as needed. 2.5 mL 5  . Prenatal Vit w/Fe-Methylfol-FA (PNV PO) Take by mouth.     No current facility-administered medications for this visit.   Allergies: Allergies  Allergen Reactions  . Sulfa Antibiotics    Social History: Social History   Socioeconomic History  . Marital status: Married    Spouse name: Not on file  . Number of children: Not on file  . Years of education: Not on file  . Highest education level: Not on file  Occupational History  . Not on file  Tobacco Use  . Smoking status: Never Smoker  . Smokeless tobacco: Never Used  Substance and Sexual Activity  . Alcohol use: Yes    Comment: 2  . Drug use: No  . Sexual activity: Yes  Other Topics Concern  . Not on file  Social History Narrative  . Not on file   Social Determinants of Health   Financial Resource Strain:   . Difficulty of Paying Living Expenses:   Food Insecurity:   . Worried About Programme researcher, broadcasting/film/video in the Last Year:   . Barista in the Last Year:   Transportation Needs:   . Freight forwarder (Medical):   Marland Kitchen Lack of Transportation (Non-Medical):   Physical Activity:   . Days of Exercise per Week:   . Minutes of Exercise per Session:   Stress:   . Feeling of Stress :   Social Connections:   . Frequency of Communication with Friends and Family:   . Frequency of Social Gatherings with Friends and Family:   . Attends Religious Services:   . Active Member of Clubs or Organizations:   . Attends Banker Meetings:   Marland Kitchen Marital Status:    Lives in a 33 year old home. Smoking: denies Occupation: Product/process development scientist HistorySurveyor, minerals in the house: no Carpet in the family room: no Carpet in the bedroom: no Heating: electric Cooling: central Pet: no  Family History: Family  History  Problem Relation Age of Onset  . Hypertension Mother   . Hypothyroidism Mother   . Bladder Cancer Maternal Grandfather   . Breast cancer Paternal Grandmother   . Diabetes Paternal Grandfather    Problem                               Relation Asthma                                   Mother  Eczema  Mother  Allergic rhino conjunctivitis     Mother   Review of Systems  Constitutional: Negative for appetite change, chills, fever and unexpected weight change.  HENT: Positive for congestion, postnasal drip, rhinorrhea, sinus pressure and sneezing.   Eyes: Negative for itching.  Respiratory: Positive for cough. Negative for chest tightness, shortness of breath and wheezing.   Cardiovascular: Negative for chest pain.  Gastrointestinal: Negative for abdominal pain.  Genitourinary: Negative for difficulty urinating.  Skin: Negative for rash.  Allergic/Immunologic: Positive for environmental allergies.  Neurological: Positive for headaches.   Objective: BP 126/74 (BP Location: Right Arm, Patient Position: Sitting, Cuff Size: Normal)   Pulse 92   Temp 98.5 F (36.9 C) (Temporal)   Resp 20   Ht 5' 8.5" (1.74 m)   Wt 235 lb 3.2 oz (106.7 kg)   SpO2 99%   BMI 35.24 kg/m  Body mass index is 35.24 kg/m. Physical Exam  Constitutional: She is oriented to person, place, and time. She appears well-developed and well-nourished.  HENT:  Head: Normocephalic and atraumatic.  Right Ear: External ear normal.  Left Ear: External ear normal.  Nose: Nose normal.  Mouth/Throat: Oropharynx is clear and moist.  Eyes: Conjunctivae and EOM are normal.  Cardiovascular: Normal rate, regular rhythm and normal heart sounds. Exam reveals no gallop and no friction rub.  No murmur heard. Pulmonary/Chest: Effort normal and breath sounds normal. She has no wheezes. She has no rales.  Abdominal: Soft.  Musculoskeletal:     Cervical back: Neck supple.  Neurological: She  is alert and oriented to person, place, and time.  Skin: Skin is warm. No rash noted.  Psychiatric: She has a normal mood and affect. Her behavior is normal.  Nursing note and vitals reviewed.  The plan was reviewed with the patient/family, and all questions/concerned were addressed.  It was my pleasure to see Melinda KettleDeanna today and participate in her care. Please feel free to contact me with any questions or concerns.  Sincerely,  Wyline MoodYoon Lourine Alberico, DO Allergy & Immunology  [facility] [place] [place] office: [phone] [facility] office: [phone] [place] office: [phone]

## 2019-07-12 ENCOUNTER — Ambulatory Visit: Payer: 59 | Admitting: Allergy

## 2019-07-12 ENCOUNTER — Other Ambulatory Visit: Payer: Self-pay

## 2019-07-12 ENCOUNTER — Encounter: Payer: Self-pay | Admitting: Allergy

## 2019-07-12 VITALS — BP 126/74 | HR 92 | Temp 98.5°F | Resp 20 | Ht 68.5 in | Wt 235.2 lb

## 2019-07-12 DIAGNOSIS — J3089 Other allergic rhinitis: Secondary | ICD-10-CM | POA: Diagnosis not present

## 2019-07-12 DIAGNOSIS — J4540 Moderate persistent asthma, uncomplicated: Secondary | ICD-10-CM | POA: Diagnosis not present

## 2019-07-12 DIAGNOSIS — J329 Chronic sinusitis, unspecified: Secondary | ICD-10-CM

## 2019-07-12 DIAGNOSIS — H1013 Acute atopic conjunctivitis, bilateral: Secondary | ICD-10-CM

## 2019-07-12 DIAGNOSIS — J302 Other seasonal allergic rhinitis: Secondary | ICD-10-CM | POA: Insufficient documentation

## 2019-07-12 DIAGNOSIS — T781XXD Other adverse food reactions, not elsewhere classified, subsequent encounter: Secondary | ICD-10-CM | POA: Diagnosis not present

## 2019-07-12 DIAGNOSIS — Z7189 Other specified counseling: Secondary | ICD-10-CM | POA: Insufficient documentation

## 2019-07-12 DIAGNOSIS — T781XXA Other adverse food reactions, not elsewhere classified, initial encounter: Secondary | ICD-10-CM | POA: Insufficient documentation

## 2019-07-12 DIAGNOSIS — Z7185 Encounter for immunization safety counseling: Secondary | ICD-10-CM

## 2019-07-12 MED ORDER — XHANCE 93 MCG/ACT NA EXHU
2.0000 | INHALANT_SUSPENSION | Freq: Two times a day (BID) | NASAL | 5 refills | Status: DC
Start: 2019-07-12 — End: 2019-07-22

## 2019-07-12 MED ORDER — OLOPATADINE HCL 0.2 % OP SOLN: 1.0000 [drp] | Freq: Every day | OPHTHALMIC | 5 refills | Status: DC | PRN

## 2019-07-12 NOTE — Assessment & Plan Note (Signed)
Discussed with patient that I am concerned if her coughing and sinus pressure may be from a chronic sinusitis.  No recent sinus imaging.  Ordered CT sinus and depending on results will adjust medical therapy.

## 2019-07-12 NOTE — Assessment & Plan Note (Signed)
Patient was diagnosed with asthma since childhood and currently on Symbicort 160 2 puffs twice a day for 1 year and albuterol as needed.  Patient had an upper respiratory infection in the summer 2020 which was treated with Augmentin however since then she has been having a lingering cough with some whitish-yellow phlegm at times.  Normal chest x-ray a few months ago per patient report.  Last antibiotic course was 3 to 4 months ago with no benefit.  Had 2 courses of prednisone in December as well.  Today's spirometry was normal with no improvement in FEV1 post bronchodilator treatment.  Clinically feeling the same. . Daily controller medication(s): continue Symbicort 160 2 puffs twice a day with spacer and rinse mouth afterwards. . Prior to physical activity: May use albuterol rescue inhaler 2 puffs 5 to 15 minutes prior to strenuous physical activities. Marland Kitchen Rescue medications: May use albuterol rescue inhaler 2 puffs or nebulizer every 4 to 6 hours as needed for shortness of breath, chest tightness, coughing, and wheezing. Monitor frequency of use.

## 2019-07-12 NOTE — Assessment & Plan Note (Signed)
Patient had COVID-19 in December 2020 and received her first Pfizer vaccine on 05/10/2019.  She had some tongue swelling 20 minutes afterwards and noted an enlarged node on the right side which is the side she had her injection.  Patient has no history of reactions to vaccines, injectable medications, polyethylene glycol, polysorbate, and previous exposure to MiraLAX or colonoscopy prep.  Discussed with patient that given her clinical symptoms will recommend COVID-19 component testing in our office - schedule this once the coughing is improved.   Must be off antihistamines for 3-5 days.  Plan on being here for 3-4 hours.  Hold covid-19 vaccines for now.   Regarding the lymph node if is not improving or getting worse advised her to follow-up with her PCP.

## 2019-07-12 NOTE — Patient Instructions (Addendum)
Today's skin testing showed: Positive to grass, weed, ragweed, trees, dust mites, cat, dog. Results given.    Environmental allergies:  Start environmental control measures.   Try Xhance 2 spray twice a day. This replaces fluticasone. Sample given and demonstrated proper use.  May use azelastine 1-2 sprays per nostril twice a day as needed for drainage.  Nasal saline spray (i.e., Simply Saline) or nasal saline lavage (i.e., NeilMed) is recommended as needed and prior to medicated nasal sprays.  May use olopatadine eye drops 0.2% once a day as needed for itchy/watery eyes.  May take Xyzal daily as needed for allergies.  Read about allergy injections.   Get CT sinus to see if you have infections that's causing the coughing.   Food:  Continue to avoid fresh apples and fresh peaches.   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 This is caused by cross reactivity of pollen with fresh fruits and vegetables, and nuts. Symptoms are usually localized in the form of itching and burning in mouth and throat. Very rarely it can progress to more severe symptoms. Eating foods in cooked or processed forms usually minimizes symptoms. I recommended avoidance of eating the problem foods, especially during the peak season(s). Sometimes, OFAS can induce severe throat swelling or even a systemic reaction; with such instance, I advised them to report to a local ER. A list of common pollens and food cross-reactivities was provided to the patient.   Asthma: . Breathing test looked normal today.  . Daily controller medication(s): continue Symbicort 160 2 puffs twice a day with spacer and rinse mouth afterwards. . Prior to physical activity: May use albuterol rescue inhaler 2 puffs 5 to 15 minutes prior to strenuous physical activities. Marland Kitchen Rescue medications: May use albuterol rescue inhaler 2 puffs or nebulizer every 4 to 6 hours as needed  for shortness of breath, chest tightness, coughing, and wheezing. Monitor frequency of use.  . Asthma control goals:  o Full participation in all desired activities (may need albuterol before activity) o Albuterol use two times or less a week on average (not counting use with activity) o Cough interfering with sleep two times or less a month o Oral steroids no more than once a year o No hospitalizations  COVID-19 vaccine:  Recommend scheduling for COVID-19 vaccine component testing - schedule this once the coughing is improved.   Must be off antihistamines for 3-5 days.  Plan on being here for 3-4 hours.  Hold additional vaccines for now.   Lymph node:  Monitor symptoms. If not improving or getting worse - please follow up with your PCP.  Follow up in 2 months or sooner if needed.   Reducing Pollen Exposure . Pollen seasons: trees (spring), grass (summer) and ragweed/weeds (fall). Marland Kitchen Keep windows closed in your home and car to lower pollen exposure.  Susa Simmonds air conditioning in the bedroom and throughout the house if possible.  . Avoid going out in dry windy days - especially early morning. . Pollen counts are highest between 5 - 10 AM and on dry, hot and windy days.  . Save outside activities for late afternoon or after a heavy rain, when pollen levels are lower.  . Avoid mowing of grass if you have grass pollen allergy. Marland Kitchen Be aware that pollen can also be transported indoors on people and pets.  . Dry your clothes in an automatic dryer rather than hanging them outside where they  might collect pollen.  . Rinse hair and eyes before bedtime. Control of House Dust Mite Allergen . Dust mite allergens are a common trigger of allergy and asthma symptoms. While they can be found throughout the house, these microscopic creatures thrive in warm, humid environments such as bedding, upholstered furniture and carpeting. . Because so much time is spent in the bedroom, it is essential to  reduce mite levels there.  . Encase pillows, mattresses, and box springs in special allergen-proof fabric covers or airtight, zippered plastic covers.  . Bedding should be washed weekly in hot water (130 F) and dried in a hot dryer. Allergen-proof covers are available for comforters and pillows that can't be regularly washed.  Reyes Ivan the allergy-proof covers every few months. Minimize clutter in the bedroom. Keep pets out of the bedroom.  Marland Kitchen Keep humidity less than 50% by using a dehumidifier or air conditioning. You can buy a humidity measuring device called a hygrometer to monitor this.  . If possible, replace carpets with hardwood, linoleum, or washable area rugs. If that's not possible, vacuum frequently with a vacuum that has a HEPA filter. . Remove all upholstered furniture and non-washable window drapes from the bedroom. . Remove all non-washable stuffed toys from the bedroom.  Wash stuffed toys weekly. Pet Allergen Avoidance: . Contrary to popular opinion, there are no "hypoallergenic" breeds of dogs or cats. That is because people are not allergic to an animal's hair, but to an allergen found in the animal's saliva, dander (dead skin flakes) or urine. Pet allergy symptoms typically occur within minutes. For some people, symptoms can build up and become most severe 8 to 12 hours after contact with the animal. People with severe allergies can experience reactions in public places if dander has been transported on the pet owners' clothing. Marland Kitchen Keeping an animal outdoors is only a partial solution, since homes with pets in the yard still have higher concentrations of animal allergens. . Before getting a pet, ask your allergist to determine if you are allergic to animals. If your pet is already considered part of your family, try to minimize contact and keep the pet out of the bedroom and other rooms where you spend a great deal of time. . As with dust mites, vacuum carpets often or replace carpet with  a hardwood floor, tile or linoleum. . High-efficiency particulate air (HEPA) cleaners can reduce allergen levels over time. . While dander and saliva are the source of cat and dog allergens, urine is the source of allergens from rabbits, hamsters, mice and [place] pigs; so ask a non-allergic family member to clean the animal's cage. . If you have a pet allergy, talk to your allergist about the potential for allergy immunotherapy (allergy shots). This strategy can often provide long-term relief.

## 2019-07-12 NOTE — Assessment & Plan Note (Signed)
Perennial rhinoconjunctivitis symptoms for 20+ years with worsening in the spring and fall.  Patient had skin testing in the past which showed multiple positives.  Records not available for review.  Patient was on allergy immunotherapy for 1 year with unknown benefit.  No prior ENT evaluation.  Tried Zyrtec, Claritin, Allegra, Singulair, Flonase and azelastine with some benefit.  Today's skin testing showed:Positive to grass, weed, ragweed, trees, dust mites, cat, dog. Results given.    Start environmental control measures.   Try Xhance 2 spray twice a day. This replaces fluticasone. Sample given and demonstrated proper use.  May use azelastine 1-2 sprays per nostril twice a day as needed for drainage.  Nasal saline spray (i.e., Simply Saline) or nasal saline lavage (i.e., NeilMed) is recommended as needed and prior to medicated nasal sprays.  May use olopatadine eye drops 0.2% once a day as needed for itchy/watery eyes.  May take Xyzal daily as needed for allergies.  Discussed if above regimen does not control symptoms then will start allergy immunotherapy.  Read about allergy injections.

## 2019-07-12 NOTE — Assessment & Plan Note (Signed)
   See assessment and plan as above for allergic rhinitis.  

## 2019-07-12 NOTE — Assessment & Plan Note (Signed)
Fresh apples and fresh peaches cause perioral pruritus and blistering.  Symptoms usually resolve within 30 minutes after Benadryl.  Today's skin prick testing was negative to apples and peaches.  Continue to avoid fresh apples and fresh peaches.   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 This is caused by cross reactivity of pollen with fresh fruits and vegetables, and nuts. Symptoms are usually localized in the form of itching and burning in mouth and throat. Very rarely it can progress to more severe symptoms. Eating foods in cooked or processed forms usually minimizes symptoms. I recommended avoidance of eating the problem foods, especially during the peak season(s). Sometimes, OFAS can induce severe throat swelling or even a systemic reaction; with such instance, I advised them to report to a local ER. A list of common pollens and food cross-reactivities was provided to the patient.

## 2019-07-18 ENCOUNTER — Telehealth: Payer: Self-pay

## 2019-07-18 NOTE — Telephone Encounter (Signed)
This is a [employer] patient, can this please be worked on in the [employer]? We have to Pre Certs that we are having to do for 2 other patient's at this time in our office on a Thursday. Thank You so very much.

## 2019-07-18 NOTE — Telephone Encounter (Signed)
[facility], called requesting a pre-service to be done ASAP as patient is scheduled for CT Maxillofacial on 07/22/2019. Please call Enrique Sack back at [phone] ext [phone] when we have completed the process.

## 2019-07-18 NOTE — Telephone Encounter (Signed)
Pre cert is done but for [facility] was denied was approved for [facility] on [address] in [place]. Will call tomorrow and speak with [facility] but also will call and inform pt and make appt for pt at [facility] imaging.

## 2019-07-19 ENCOUNTER — Telehealth: Payer: Self-pay | Admitting: Family Medicine

## 2019-07-19 ENCOUNTER — Telehealth: Payer: Self-pay

## 2019-07-19 NOTE — Telephone Encounter (Signed)
Pa submitted thru cover my meds for xhance waiting on response form insurance

## 2019-07-19 NOTE — Telephone Encounter (Signed)
Pt is scheduled for June 4th at 8am at [facility]. Pt is informed of this, faxed over pre approval letter from insurance to [facility] and destiny at [facility] was informed.

## 2019-07-19 NOTE — Telephone Encounter (Signed)
Made in error

## 2019-07-19 NOTE — Telephone Encounter (Signed)
Med center called wanting to know the location of the CT scan.  Notes indicate maxillofacial however previous note from [place] states patient will be seen at [facility] imaging.  Please advise.  Destiny at med center called [phone]

## 2019-07-22 ENCOUNTER — Ambulatory Visit (HOSPITAL_BASED_OUTPATIENT_CLINIC_OR_DEPARTMENT_OTHER): Payer: PRIVATE HEALTH INSURANCE

## 2019-07-22 ENCOUNTER — Telehealth: Payer: Self-pay | Admitting: *Deleted

## 2019-07-22 MED ORDER — XHANCE 93 MCG/ACT NA EXHU: 1.0000 | INHALANT_SUSPENSION | Freq: Two times a day (BID) | NASAL | 5 refills | Status: DC

## 2019-07-22 NOTE — Telephone Encounter (Signed)
Okay sent in 1 spray BID.

## 2019-07-22 NOTE — Telephone Encounter (Signed)
Pharmacy called stating that insurance will only cover 1 spray twice a day (1 bottle). They will not cover 2 spray twice day. Okay to resend as 1 spray BID? Please advise.

## 2019-08-09 ENCOUNTER — Ambulatory Visit
Admission: RE | Admit: 2019-08-09 | Discharge: 2019-08-09 | Disposition: A | Discharge status: Home / Self Care | Payer: 59 | Source: Ambulatory Visit | Attending: Allergy | Admitting: Allergy

## 2019-08-09 ENCOUNTER — Other Ambulatory Visit: Payer: PRIVATE HEALTH INSURANCE

## 2019-08-09 DIAGNOSIS — J329 Chronic sinusitis, unspecified: Secondary | ICD-10-CM

## 2019-09-20 ENCOUNTER — Ambulatory Visit: Payer: 59 | Admitting: Family Medicine

## 2019-11-08 ENCOUNTER — Ambulatory Visit: Payer: 59 | Admitting: Family Medicine

## 2019-11-20 ENCOUNTER — Other Ambulatory Visit: Payer: Self-pay | Admitting: Allergy

## 2019-12-12 NOTE — Progress Notes (Signed)
[address] [place] [address] Dept: [phone]  FOLLOW UP NOTE  Patient ID: Melinda Mccarthy, female    DOB: 01-Aug-1985  Age: 34 y.o. MRN: [id number] Date of Office Visit: 12/13/2019  Assessment  Chief Complaint: Allergic Rhinitis  (doing well) and Asthma  HPI Melinda Mccarthy is a 34 year old female who presents to the clinic for follow-up visit.  She was last seen in this clinic on 07/12/2019 by Dr. Selena Batten for evaluation of asthma, allergic rhinitis, and allergic conjunctivitis.  At today's visit she reports her asthma has been moderately well controlled with some wheeze over the last couple of mornings as well as cough producing thick yellow mucus in the morning only.  She continues Symbicort 160-2 puffs twice a day with a spacer and uses her albuterol about once a month.  She reports Symbicort is financially draining.  Allergic rhinitis is reported as poorly controlled with symptoms including nasal congestion and thick postnasal drainage for which she continues XHANCE 2 sprays in each nostril twice a day, Xyzal 5 mg once a day, saline rinses on a regular basis, and azelastine about once a week.  Allergic conjunctivitis is reported as well controlled with over-the-counter allergy drops.  She does report reflux occurring about once a week for which she uses Tums for relief of symptoms.  She received her first Pfizer Covid vaccine on 05/20/2019 and experienced tongue swelling for which she took Benadryl with resolution of symptoms.  Her current medications are listed in the chart.   Drug Allergies:  Allergies  Allergen Reactions  . Sulfa Antibiotics     Physical Exam: BP 110/80   Pulse 94   Temp 97.8 F (36.6 C) (Temporal)   Resp 16   SpO2 98%    Physical Exam Vitals reviewed.  Constitutional:      Appearance: Normal appearance.  HENT:     Head: Normocephalic and atraumatic.     Right Ear: Tympanic membrane normal.     Left Ear: Tympanic membrane normal.     Nose:      Comments: Bilateral nares edematous and pale with clear nasal drainage noted.  Pharynx is slightly erythematous with no exudate.  Ears normal.  Eyes normal.    Mouth/Throat:     Pharynx: Oropharynx is clear.  Eyes:     Conjunctiva/sclera: Conjunctivae normal.  Cardiovascular:     Rate and Rhythm: Normal rate and regular rhythm.     Heart sounds: Normal heart sounds. No murmur heard.   Pulmonary:     Effort: Pulmonary effort is normal.     Breath sounds: Normal breath sounds.     Comments: Lungs clear to auscultation Musculoskeletal:        General: Normal range of motion.     Cervical back: Normal range of motion and neck supple.  Skin:    General: Skin is warm and dry.  Neurological:     Mental Status: She is alert and oriented to person, place, and time.  Psychiatric:        Mood and Affect: Mood normal.        Behavior: Behavior normal.        Thought Content: Thought content normal.        Judgment: Judgment normal.     Diagnostics: FVC 3.95, FEV1 2.42.  Predicted FVC 4.38, predicted FEV1 3.62.  Spirometry indicates normal ventilatory function.  Assessment and Plan: 1. Moderate persistent asthma without complication   2. Allergic conjunctivitis of both eyes   3.  Seasonal and perennial allergic rhinitis   4. Gastroesophageal reflux disease, unspecified whether esophagitis present   5. Vaccine counseling     Meds ordered this encounter  Medications  . Fluticasone-Salmeterol,sensor, (AIRDUO DIGIHALER) 232-14 MCG/ACT AEPB    Sig: Inhale 1 puff into the lungs 2 (two) times daily. Rinse, gargle and spit out after use    Dispense:  1 each    Refill:  5    Patient Instructions  Asthma Stop Symbicort and begin Air Duo DigiHaler 1 puff every 12 hours with a spacer to prevent cough or wheeze Continue albuterol 2 puffs every 4 hours as needed for cough or wheeze OR Instead use albuterol 0.083% solution via nebulizer one unit vial every 4 hours as needed for cough or  wheeze  Allergic rhinitis Continue avoidance measures directed toward grass pollen, tree pollen, weed pollen, ragweed pollen, mold, dust mite, cat, and dog as listed below Stop Xyzal and try RyVent 6 mg twice a day Continue XHANCE 2 sprays in each nostril twice a day as needed for stuffy nose Continue azelastine 2 sprays in each nostril twice a day as needed for a runny nose Consider saline nasal rinses as needed for nasal symptoms. Use this before any medicated nasal sprays for best result For thick post nasal drianinge, begin Mucinex [phone] mg twice a day If your medications are not working well to relieve your symptoms consider allergy injections.   Allergic conjunctivitis Continue Pataday 1 drop in each eye once a day as needed for red, itchy eyes  Reflux Begin dietary and lifestyle modifications as listed below  Covid component testing Consider covid component testing. Remember to stop antihistamines 3 days before the testing appointment  Call the clinic if this treatment plan is not working well for you  Follow up in 5 months or sooner if needed.   Return in about 5 months (around 05/12/2020), or if symptoms worsen or fail to improve.    Thank you for the opportunity to care for this patient.  Please do not hesitate to contact me with questions.  Thermon Leyland, FNP [facility]

## 2019-12-13 ENCOUNTER — Other Ambulatory Visit: Payer: Self-pay | Admitting: Allergy

## 2019-12-13 ENCOUNTER — Encounter: Payer: Self-pay | Admitting: Family Medicine

## 2019-12-13 ENCOUNTER — Ambulatory Visit: Payer: 59 | Admitting: Family Medicine

## 2019-12-13 ENCOUNTER — Other Ambulatory Visit: Payer: Self-pay

## 2019-12-13 VITALS — BP 110/80 | HR 94 | Temp 97.8°F | Resp 16

## 2019-12-13 DIAGNOSIS — H1013 Acute atopic conjunctivitis, bilateral: Secondary | ICD-10-CM | POA: Diagnosis not present

## 2019-12-13 DIAGNOSIS — J3089 Other allergic rhinitis: Secondary | ICD-10-CM | POA: Diagnosis not present

## 2019-12-13 DIAGNOSIS — J4540 Moderate persistent asthma, uncomplicated: Secondary | ICD-10-CM | POA: Diagnosis not present

## 2019-12-13 DIAGNOSIS — Z7185 Encounter for immunization safety counseling: Secondary | ICD-10-CM

## 2019-12-13 DIAGNOSIS — J302 Other seasonal allergic rhinitis: Secondary | ICD-10-CM

## 2019-12-13 DIAGNOSIS — K219 Gastro-esophageal reflux disease without esophagitis: Secondary | ICD-10-CM | POA: Diagnosis not present

## 2019-12-13 MED ORDER — AIRDUO DIGIHALER 232-14 MCG/ACT IN AEPB: 1.0000 | INHALATION_SPRAY | Freq: Two times a day (BID) | RESPIRATORY_TRACT | 5 refills | Status: DC

## 2019-12-13 MED ORDER — XHANCE 93 MCG/ACT NA EXHU: INHALANT_SUSPENSION | NASAL | 1 refills | Status: DC

## 2019-12-13 NOTE — Patient Instructions (Addendum)
Asthma Stop Symbicort and begin Air Duo DigiHaler 1 puff every 12 hours with a spacer to prevent cough or wheeze Continue albuterol 2 puffs every 4 hours as needed for cough or wheeze OR Instead use albuterol 0.083% solution via nebulizer one unit vial every 4 hours as needed for cough or wheeze  Allergic rhinitis Continue avoidance measures directed toward grass pollen, tree pollen, weed pollen, ragweed pollen, mold, dust mite, cat, and dog as listed below Stop Xyzal and try RyVent 6 mg twice a day Continue XHANCE 2 sprays in each nostril twice a day as needed for stuffy nose Continue azelastine 2 sprays in each nostril twice a day as needed for a runny nose Consider saline nasal rinses as needed for nasal symptoms. Use this before any medicated nasal sprays for best result For thick post nasal drianinge, begin Mucinex [phone] mg twice a day If your medications are not working well to relieve your symptoms consider allergy injections.   Allergic conjunctivitis Continue Pataday 1 drop in each eye once a day as needed for red, itchy eyes  Reflux Begin dietary and lifestyle modifications as listed below  Covid component testing Consider covid component testing. Remember to stop antihistamines 3 days before the testing appointment  Call the clinic if this treatment plan is not working well for you  Follow up in 5 months or sooner if needed.   Lifestyle Changes for Controlling GERD When you have GERD, stomach acid feels as if it's backing up toward your mouth. Whether or not you take medication to control your GERD, your symptoms can often be improved with lifestyle changes.   Raise Your Head  Reflux is more likely to strike when you're lying down flat, because stomach fluid can  flow backward more easily. Raising the head of your bed 4-6 inches can help. To do this:  Slide blocks or books under the legs at the head of your bed. Or, place a wedge under  the mattress. Many foam  stores can make a suitable wedge for you. The wedge  should run from your waist to the top of your head.  Don't just prop your head on several pillows. This increases pressure on your  stomach. It can make GERD worse.  Watch Your Eating Habits Certain foods may increase the acid in your stomach or relax the lower esophageal sphincter, making GERD more likely. It's best to avoid the following:  Coffee, tea, and carbonated drinks (with and without caffeine)  Fatty, fried, or spicy food  Mint, chocolate, onions, and tomatoes  Any other foods that seem to irritate your stomach or cause you pain  Relieve the Pressure  Eat smaller meals, even if you have to eat more often.  Don't lie down right after you eat. Wait a few hours for your stomach to empty.  Avoid tight belts and tight-fitting clothes.  Lose excess weight.  Tobacco and Alcohol  Avoid smoking tobacco and drinking alcohol. They can make GERD symptoms worse.

## 2019-12-16 ENCOUNTER — Other Ambulatory Visit: Payer: Self-pay

## 2019-12-16 ENCOUNTER — Telehealth: Payer: Self-pay | Admitting: Family Medicine

## 2019-12-16 MED ORDER — CARBINOXAMINE MALEATE 6 MG PO TABS: 6.0000 mg | ORAL_TABLET | Freq: Two times a day (BID) | ORAL | 5 refills | Status: DC | PRN

## 2019-12-16 NOTE — Telephone Encounter (Signed)
[facility] to [facility] and informed pt of me doing so she stated her understanding

## 2019-12-16 NOTE — Telephone Encounter (Signed)
Pt. Request prescription for ryvent as discussed at appt on 10/8.

## 2020-01-01 ENCOUNTER — Telehealth: Payer: Self-pay | Admitting: *Deleted

## 2020-01-01 NOTE — Telephone Encounter (Signed)
PA approved for [facility] through cover my meds. Faxed approval to pharmacy.   Approved today Effective from 01/01/2020 through 12/30/2020.

## 2020-01-06 ENCOUNTER — Other Ambulatory Visit: Payer: Self-pay

## 2020-01-17 DIAGNOSIS — J45909 Unspecified asthma, uncomplicated: Secondary | ICD-10-CM | POA: Diagnosis not present

## 2020-01-17 DIAGNOSIS — Z1331 Encounter for screening for depression: Secondary | ICD-10-CM | POA: Diagnosis not present

## 2020-01-17 DIAGNOSIS — R599 Enlarged lymph nodes, unspecified: Secondary | ICD-10-CM | POA: Diagnosis not present

## 2020-01-17 DIAGNOSIS — E039 Hypothyroidism, unspecified: Secondary | ICD-10-CM | POA: Diagnosis not present

## 2020-01-17 DIAGNOSIS — E119 Type 2 diabetes mellitus without complications: Secondary | ICD-10-CM | POA: Diagnosis not present

## 2020-01-24 DIAGNOSIS — R221 Localized swelling, mass and lump, neck: Secondary | ICD-10-CM | POA: Diagnosis not present

## 2020-01-24 DIAGNOSIS — R599 Enlarged lymph nodes, unspecified: Secondary | ICD-10-CM | POA: Diagnosis not present

## 2020-02-19 DIAGNOSIS — R590 Localized enlarged lymph nodes: Secondary | ICD-10-CM | POA: Diagnosis not present

## 2020-02-26 ENCOUNTER — Other Ambulatory Visit: Payer: Self-pay | Admitting: Family Medicine

## 2020-04-17 DIAGNOSIS — Z6836 Body mass index (BMI) 36.0-36.9, adult: Secondary | ICD-10-CM | POA: Diagnosis not present

## 2020-04-17 DIAGNOSIS — E119 Type 2 diabetes mellitus without complications: Secondary | ICD-10-CM | POA: Diagnosis not present

## 2020-04-17 DIAGNOSIS — J45909 Unspecified asthma, uncomplicated: Secondary | ICD-10-CM | POA: Diagnosis not present

## 2020-04-17 DIAGNOSIS — E039 Hypothyroidism, unspecified: Secondary | ICD-10-CM | POA: Diagnosis not present

## 2020-05-05 ENCOUNTER — Other Ambulatory Visit: Payer: Self-pay | Admitting: Family Medicine

## 2020-06-05 DIAGNOSIS — E119 Type 2 diabetes mellitus without complications: Secondary | ICD-10-CM | POA: Diagnosis not present

## 2020-06-08 IMAGING — CT CT MAXILLOFACIAL W/O CM
3 of 5 series · 14 of 47 positions shown, 16 images · non-contrast
Comparison: CT sinus 05/06/2011

CLINICAL DATA: Chronic sinusitis.

EXAM:
CT MAXILLOFACIAL WITHOUT CONTRAST
TECHNIQUE: Multidetector CT images of the paranasal sinuses were obtained using
the standard protocol without intravenous contrast.

[Series 2: sinus 2.00 hr60 s3 axial · axial · 0.36mm/px · z∈[-586,-492]mm · 8 of 61 slices shown, 10 images]
[im 7/61  brain]
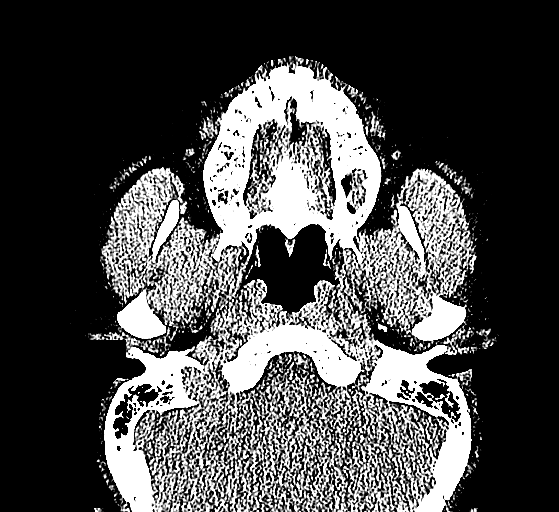
[im 7/61  bone]
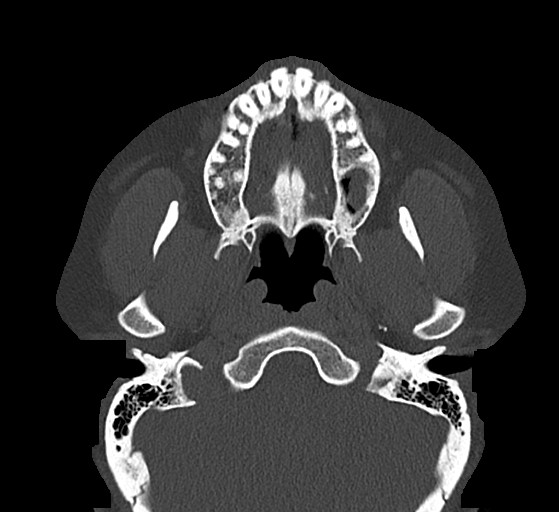
[im 14/61  bone]
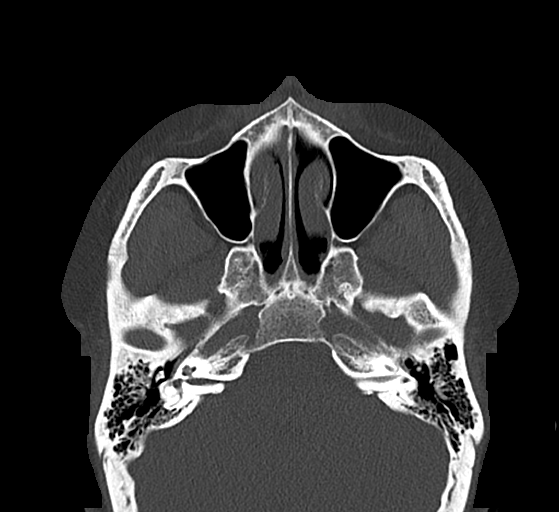
[im 21/61  bone]
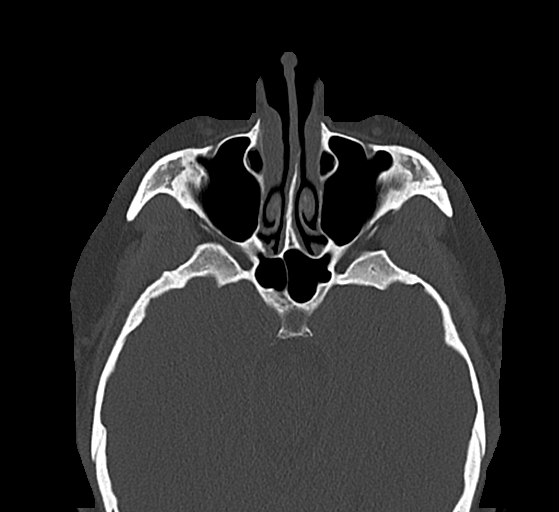
[im 27/61  bone]
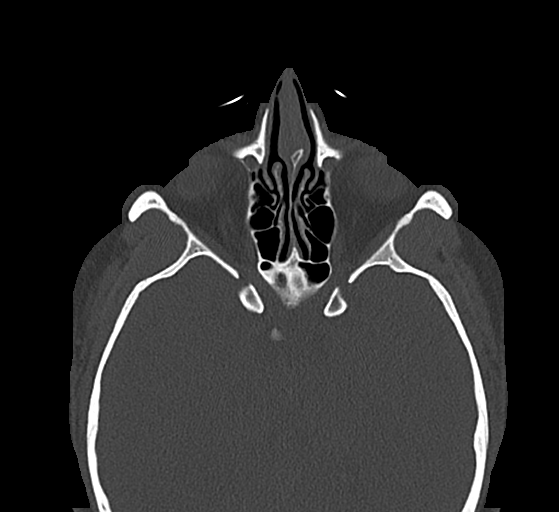
[im 34/61  brain]
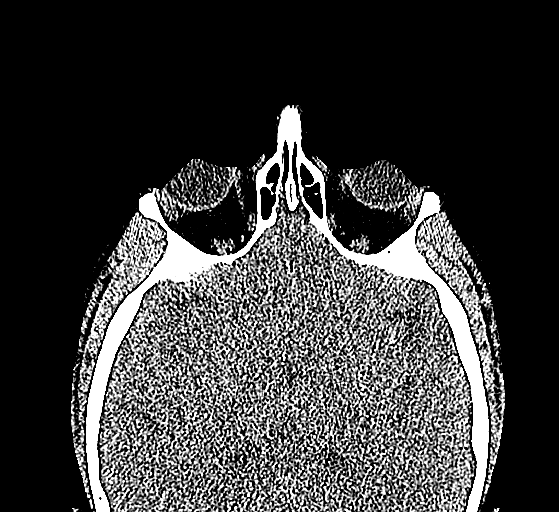
[im 34/61  bone]
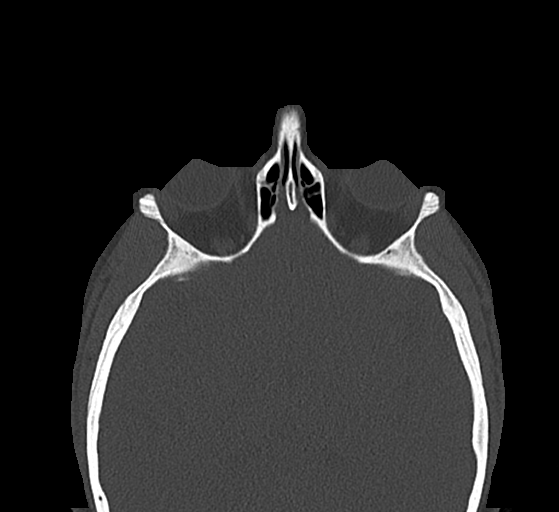
[im 41/61  bone]
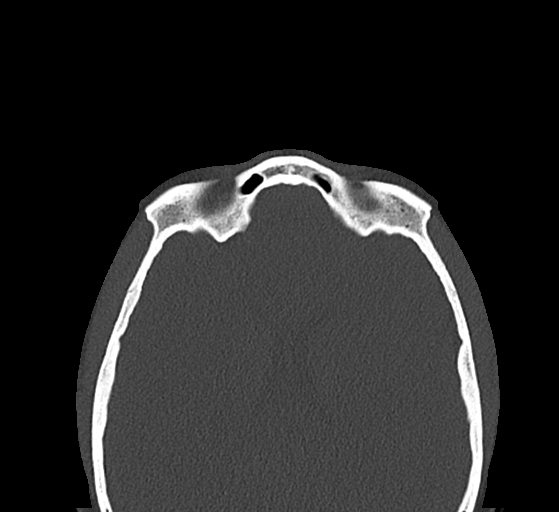
[im 47/61  bone]
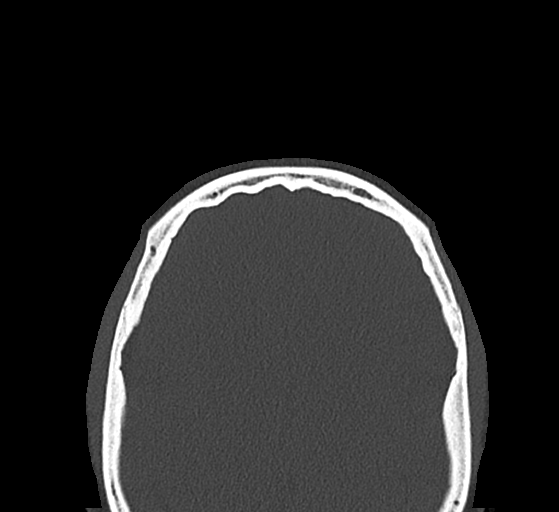
[im 54/61  bone]
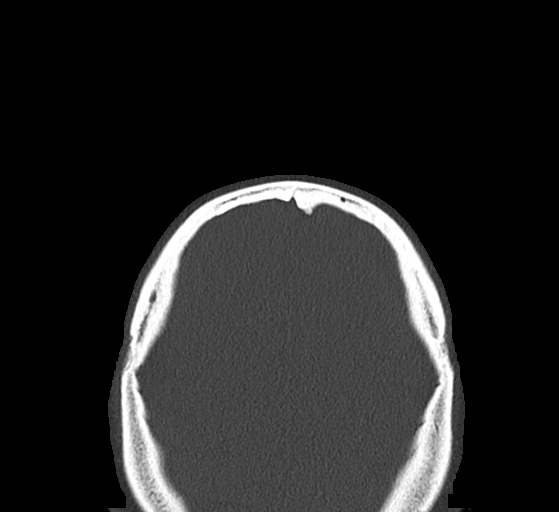

[Series 4: sinus 2.00 hr60 s3 cor · coronal · 0.24mm/px · 3 of 91 slices shown]
[im 31/91  bone]
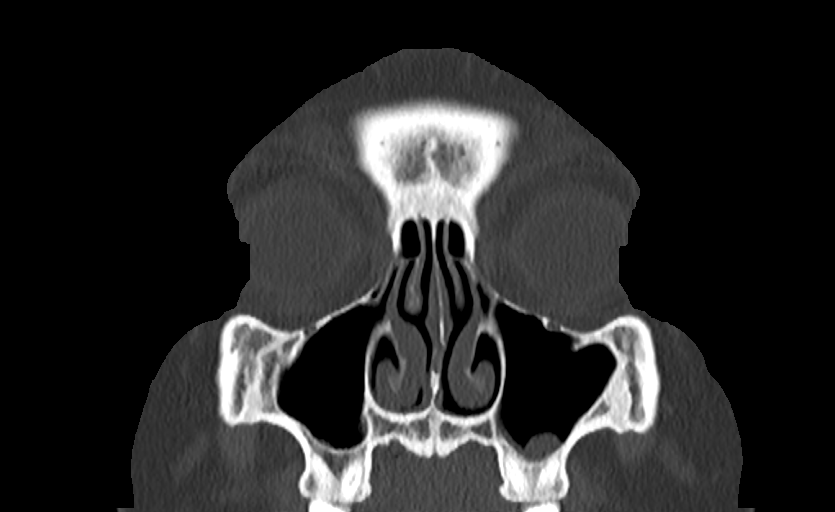
[im 41/91  bone]
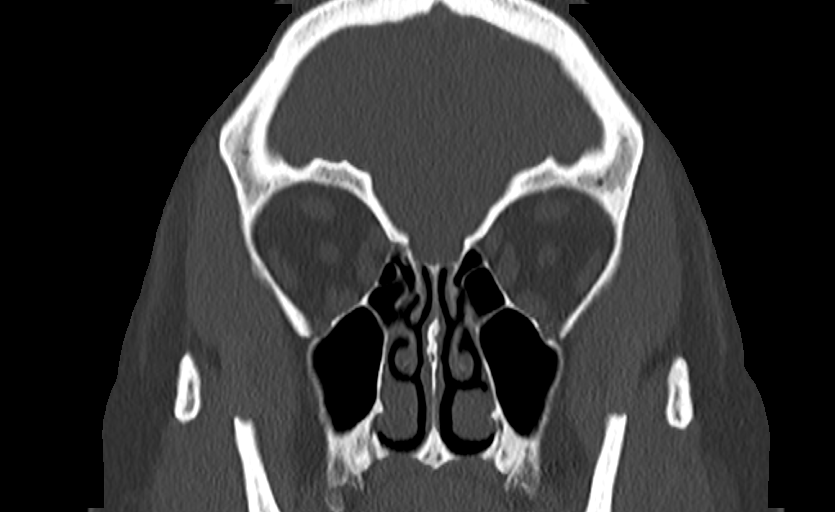
[im 51/91  bone]
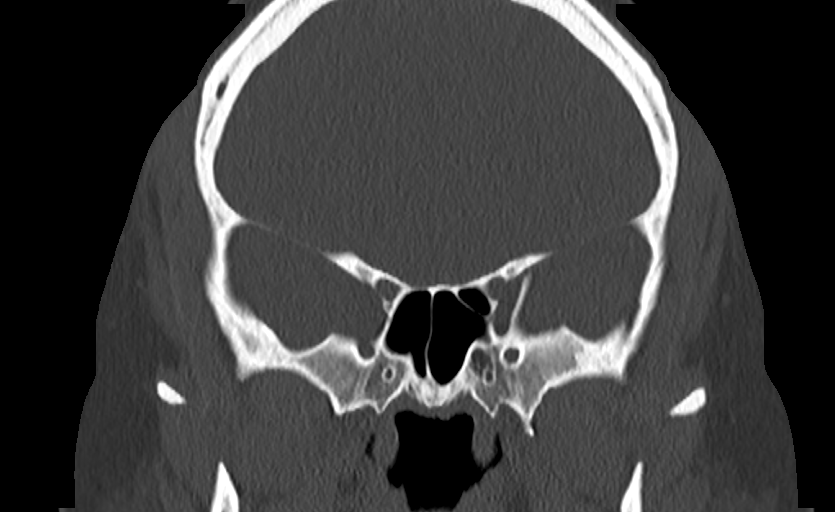

[Series 6: sinus 2.00 hr60 s3 sag · sagittal · 0.24mm/px · 3 of 99 slices shown]
[im 33/99  bone]
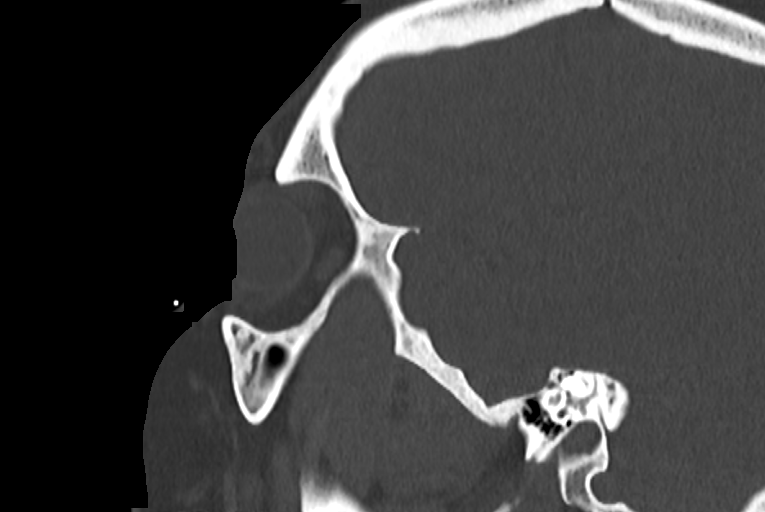
[im 50/99  bone]
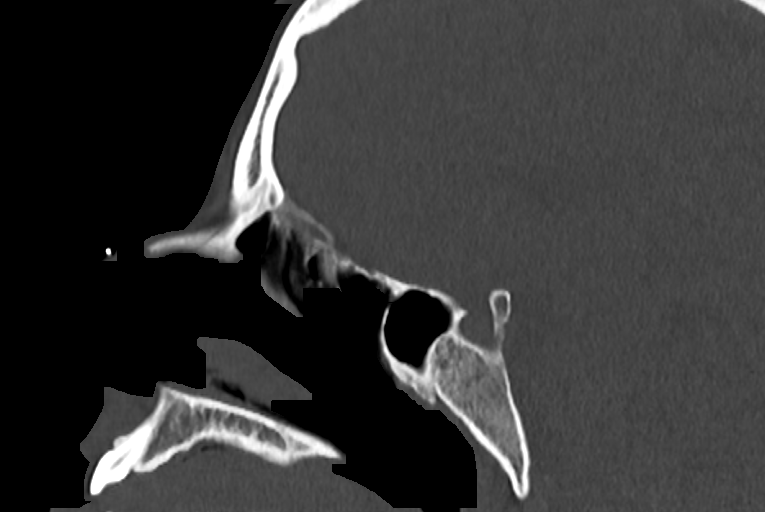
[im 66/99  bone]
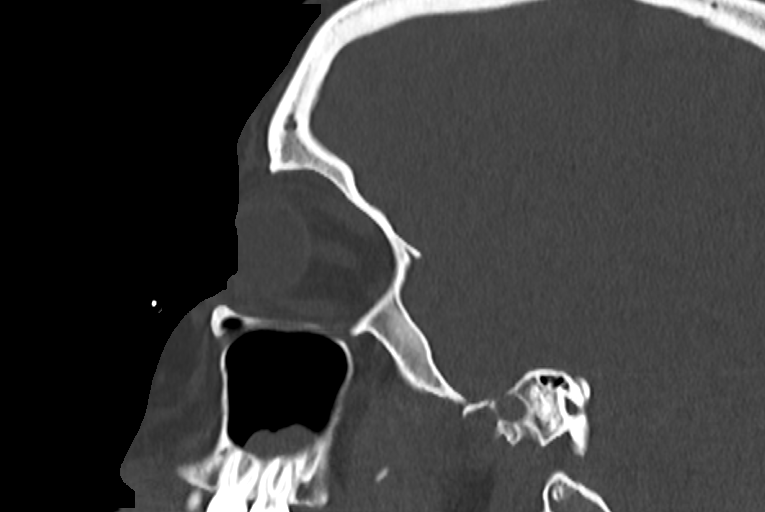

[14 of 47 positions shown; findings below may reference images not displayed]

FINDINGS: Paranasal sinuses:

Frontal: Normally aerated. Patent frontal sinus drainage pathways.

Ethmoid: Normally aerated.

Maxillary: Small retention cyst base of left maxillary sinus is
smaller compared to 3278. Minimal mucosal edema right maxillary
sinus. No air-fluid level

Sphenoid: Normally aerated. Patent sphenoethmoidal recesses.

Mastoid sinus: Clear bilaterally.

Right ostiomeatal unit: Patent.

Left ostiomeatal unit: Patent.

Nasal passages: Patent. Mild leftward septal deviation anteriorly.

Anatomy: Keros type 3 olfactory recess.

No acute skeletal abnormality

Limited intracranial imaging.

Negative orbit
IMPRESSION: Mild mucosal edema in the maxillary sinuses, with improvement since
3278. Remaining sinuses clear. Sinus drainage pathways are clear.

## 2020-06-19 ENCOUNTER — Other Ambulatory Visit: Payer: Self-pay | Admitting: Allergy

## 2020-06-27 ENCOUNTER — Other Ambulatory Visit: Payer: Self-pay | Admitting: Family Medicine

## 2020-07-08 ENCOUNTER — Other Ambulatory Visit: Payer: Self-pay

## 2020-07-08 ENCOUNTER — Ambulatory Visit: Payer: BC Managed Care – PPO | Admitting: Allergy and Immunology

## 2020-07-08 ENCOUNTER — Encounter: Payer: Self-pay | Admitting: Allergy and Immunology

## 2020-07-08 VITALS — BP 118/82 | HR 80 | Resp 14 | Ht 68.6 in | Wt 236.6 lb

## 2020-07-08 DIAGNOSIS — J3089 Other allergic rhinitis: Secondary | ICD-10-CM

## 2020-07-08 DIAGNOSIS — T781XXD Other adverse food reactions, not elsewhere classified, subsequent encounter: Secondary | ICD-10-CM

## 2020-07-08 DIAGNOSIS — H1013 Acute atopic conjunctivitis, bilateral: Secondary | ICD-10-CM | POA: Diagnosis not present

## 2020-07-08 DIAGNOSIS — T7819XD Other adverse food reactions, not elsewhere classified, subsequent encounter: Secondary | ICD-10-CM

## 2020-07-08 DIAGNOSIS — J301 Allergic rhinitis due to pollen: Secondary | ICD-10-CM | POA: Diagnosis not present

## 2020-07-08 DIAGNOSIS — H1010 Acute atopic conjunctivitis, unspecified eye: Secondary | ICD-10-CM

## 2020-07-08 DIAGNOSIS — J4540 Moderate persistent asthma, uncomplicated: Secondary | ICD-10-CM | POA: Diagnosis not present

## 2020-07-08 MED ORDER — AIRDUO DIGIHALER 232-14 MCG/ACT IN AEPB: INHALATION_SPRAY | RESPIRATORY_TRACT | 5 refills | Status: DC

## 2020-07-08 MED ORDER — AUVI-Q 0.3 MG/0.3ML IJ SOAJ: INTRAMUSCULAR | 3 refills | Status: DC

## 2020-07-08 MED ORDER — XHANCE 93 MCG/ACT NA EXHU: INHALANT_SUSPENSION | NASAL | 5 refills | Status: DC

## 2020-07-08 NOTE — Patient Instructions (Addendum)
  1.  Perform allergen avoidance measures as best as possible. Sports glasses while outdoors  2.  Continue air duo 232 - 1 inhalation 1-2 times per day depending on disease activity  3. Continue Xhance -2 sprays each nostril 1-2 times per day depending on disease activity  4.  If needed:   A. Albuterol HFA - 2 inhalations every 4-6 hours  B. Xyzal 5 mg - 1 tablet 1-2 times per day  C. Pataday Extra Strength- 1 drop each eye 1 time per day  D. Auvi-Q 0.3, benadryl, MD/ER evaluation for allergic reaction  5. Immunotherapy???  6. Return  to clinic in 6 months or earlier if problem.

## 2020-07-08 NOTE — Progress Notes (Signed)
[facility] - [place] - [place] - [place] - [place]   Follow-up Note  Referring Provider: No ref. provider found Primary Provider: Marlyn Corporal, PA Date of Office Visit: 07/08/2020  Subjective:   Melinda Mccarthy (DOB: 1985-11-06) is a 35 y.o. female who [facility] in re-evaluation of the following:  HPI: Melinda Mccarthy returns to this clinic in evaluation of asthma and allergic rhinitis and oral allergy syndrome.  I have never seen her in this clinic and her last visit with our nurse practitioner occurred on 13 December 2019.  She has had excellent control of her asthma since her last visit without the need for systemic steroid and rare use of a short acting bronchodilator certainly less than 1 time per week and can exercise with any difficulty while she continues on a combination inhaler on a consistent basis.  Likewise, she has had very good control of her upper airway symptoms while using Xhance nasal spray on a consistent basis along with an antihistamine.  Her eyes have been causing her lots of problem this spring with intense itchiness and she is using some over-the-counter eyedrops.  She remains away from consumption of apples and peaches because of poor oral allergy syndrome.  Allergies as of 07/08/2020      Reactions   Sulfa Antibiotics       Medication List    AirDuo Digihaler 232-14 MCG/ACT Aepb Generic drug: Fluticasone-Salmeterol(sensor) INHALE 1 PUFF INTO LUNGS 2 TIMES DAILY. RINSE, GARGLE AND SPIT OUT AFTER USE   albuterol 108 (90 Base) MCG/ACT inhaler Commonly known as: VENTOLIN HFA Inhale into the lungs every 6 (six) hours as needed for wheezing or shortness of breath.   albuterol (5 MG/ML) 0.5% nebulizer solution Commonly known as: PROVENTIL Take 2.5 mg by nebulization every 6 (six) hours as needed for wheezing or shortness of breath.   levocetirizine 5 MG tablet Commonly known as: XYZAL Take 5 mg by mouth every  evening.   levothyroxine 75 MCG tablet Commonly known as: SYNTHROID Take 75 mcg by mouth daily.   Xhance 93 MCG/ACT Exhu Generic drug: Fluticasone Propionate BLOW 2 DOSES IN EACH NOSTRIL TWICE DAILY       Past Medical History:  Diagnosis Date  . Asthma   . Hemochromatosis carrier   . Hypothyroid   . PCOS (polycystic ovarian syndrome)   . Pre-diabetes     Past Surgical History:  Procedure Laterality Date  . CESAREAN SECTION  2016  . CESAREAN SECTION  2019  . WISDOM TOOTH EXTRACTION      Review of systems negative except as noted in HPI / PMHx or noted below:  Review of Systems  Constitutional: Negative.   HENT: Negative.   Eyes: Negative.   Respiratory: Negative.   Cardiovascular: Negative.   Gastrointestinal: Negative.   Genitourinary: Negative.   Musculoskeletal: Negative.   Skin: Negative.   Neurological: Negative.   Endo/Heme/Allergies: Negative.   Psychiatric/Behavioral: Negative.      Objective:   Vitals:   07/08/20 0817  BP: 118/82  Pulse: 80  Resp: 14  SpO2: 97%   Height: 5' 8.6" (174.2 cm)  Weight: 236 lb 9.6 oz (107.3 kg)   Physical Exam Constitutional:      Appearance: She is not diaphoretic.  HENT:     Head: Normocephalic.     Right Ear: Tympanic membrane, ear canal and external ear normal.     Left Ear: Tympanic membrane, ear canal and external ear normal.  Nose: Nose normal. No mucosal edema or rhinorrhea.     Mouth/Throat:     Pharynx: Uvula midline. No oropharyngeal exudate.  Eyes:     Conjunctiva/sclera: Conjunctivae normal.  Neck:     Thyroid: No thyromegaly.     Trachea: Trachea normal. No tracheal tenderness or tracheal deviation.  Cardiovascular:     Rate and Rhythm: Normal rate and regular rhythm.     Heart sounds: Normal heart sounds, S1 normal and S2 normal. No murmur heard.   Pulmonary:     Effort: No respiratory distress.     Breath sounds: Normal breath sounds. No stridor. No wheezing or rales.   Lymphadenopathy:     Head:     Right side of head: No tonsillar adenopathy.     Left side of head: No tonsillar adenopathy.     Cervical: No cervical adenopathy.  Skin:    Findings: No erythema or rash.     Nails: There is no clubbing.  Neurological:     Mental Status: She is alert.     Diagnostics:    Spirometry was performed and demonstrated an FEV1 of 3.19 at 86 % of predicted.   Assessment and Plan:   1. Asthma, moderate persistent, well-controlled   2. Perennial allergic rhinitis   3. Seasonal allergic rhinitis due to pollen   4. Seasonal allergic conjunctivitis   5. Pollen-food allergy, subsequent encounter     1.  Perform allergen avoidance measures as best as possible. Sports glasses while outdoors  2.  Continue air duo 232 - 1 inhalation 1-2 times per day depending on disease activity  3. Continue Xhance -2 sprays each nostril 1-2 times per day depending on disease activity  4.  If needed:   A. Albuterol HFA - 2 inhalations every 4-6 hours  B. Xyzal 5 mg - 1 tablet 1-2 times per day  C. Pataday Extra Strength- 1 drop each eye 1 time per day  D. Auvi-Q 0.3, benadryl, MD/ER evaluation for allergic reaction  5. Immunotherapy???  6. Return  to clinic in 6 months or earlier if problem.  [name] appears to be doing very well on her current therapy which includes anti-inflammatory medications for her airway and some as needed medications.  We will continue her on this treatment and see her back in this clinic in 6 months or earlier if there is a problem.  She would definitely be a candidate for immunotherapy should she fail this medical plan.  [name], MD Allergy / Immunology [facility]

## 2020-07-09 ENCOUNTER — Encounter: Payer: Self-pay | Admitting: Allergy and Immunology

## 2020-07-24 DIAGNOSIS — E119 Type 2 diabetes mellitus without complications: Secondary | ICD-10-CM | POA: Diagnosis not present

## 2020-07-24 DIAGNOSIS — J45909 Unspecified asthma, uncomplicated: Secondary | ICD-10-CM | POA: Diagnosis not present

## 2020-07-24 DIAGNOSIS — J019 Acute sinusitis, unspecified: Secondary | ICD-10-CM | POA: Diagnosis not present

## 2020-07-24 DIAGNOSIS — E039 Hypothyroidism, unspecified: Secondary | ICD-10-CM | POA: Diagnosis not present

## 2020-07-27 ENCOUNTER — Telehealth: Payer: Self-pay | Admitting: Allergy and Immunology

## 2020-07-27 NOTE — Telephone Encounter (Signed)
Pharmacy ([facility] ) Stating needed clarification on RX Xhance Nasel Spray dosage please advise

## 2020-07-27 NOTE — Telephone Encounter (Signed)
Spoke with pharmacy and was able to clarify the order and that patient has refills for Rx order that was made both 4/18 and 07/08/20.

## 2020-09-04 DIAGNOSIS — Z148 Genetic carrier of other disease: Secondary | ICD-10-CM | POA: Diagnosis not present

## 2020-12-09 DIAGNOSIS — D509 Iron deficiency anemia, unspecified: Secondary | ICD-10-CM | POA: Diagnosis not present

## 2020-12-09 DIAGNOSIS — E039 Hypothyroidism, unspecified: Secondary | ICD-10-CM | POA: Diagnosis not present

## 2020-12-09 DIAGNOSIS — J45909 Unspecified asthma, uncomplicated: Secondary | ICD-10-CM | POA: Diagnosis not present

## 2020-12-09 DIAGNOSIS — E119 Type 2 diabetes mellitus without complications: Secondary | ICD-10-CM | POA: Diagnosis not present

## 2020-12-10 ENCOUNTER — Other Ambulatory Visit: Payer: Self-pay | Admitting: *Deleted

## 2020-12-10 MED ORDER — XHANCE 93 MCG/ACT NA EXHU: INHALANT_SUSPENSION | NASAL | 5 refills | Status: DC

## 2021-01-12 ENCOUNTER — Other Ambulatory Visit: Payer: Self-pay

## 2021-01-13 ENCOUNTER — Telehealth: Payer: Self-pay | Admitting: *Deleted

## 2021-01-13 NOTE — Telephone Encounter (Signed)
PA has been submitted through CoverMyMeds for Xhance and is currently pending approval/denial.  

## 2021-01-14 NOTE — Telephone Encounter (Signed)
PA is still pending.  

## 2021-01-20 ENCOUNTER — Other Ambulatory Visit: Payer: Self-pay

## 2021-01-20 ENCOUNTER — Encounter: Payer: Self-pay | Admitting: Allergy and Immunology

## 2021-01-20 ENCOUNTER — Ambulatory Visit (INDEPENDENT_AMBULATORY_CARE_PROVIDER_SITE_OTHER): Payer: BC Managed Care – PPO | Admitting: Allergy and Immunology

## 2021-01-20 VITALS — BP 132/88 | HR 88 | Resp 12

## 2021-01-20 DIAGNOSIS — H1013 Acute atopic conjunctivitis, bilateral: Secondary | ICD-10-CM | POA: Diagnosis not present

## 2021-01-20 DIAGNOSIS — J3089 Other allergic rhinitis: Secondary | ICD-10-CM

## 2021-01-20 DIAGNOSIS — J301 Allergic rhinitis due to pollen: Secondary | ICD-10-CM

## 2021-01-20 DIAGNOSIS — H1010 Acute atopic conjunctivitis, unspecified eye: Secondary | ICD-10-CM

## 2021-01-20 DIAGNOSIS — J4540 Moderate persistent asthma, uncomplicated: Secondary | ICD-10-CM

## 2021-01-20 DIAGNOSIS — T781XXD Other adverse food reactions, not elsewhere classified, subsequent encounter: Secondary | ICD-10-CM

## 2021-01-20 MED ORDER — OMEPRAZOLE 40 MG PO CPDR: DELAYED_RELEASE_CAPSULE | ORAL | 5 refills | Status: DC

## 2021-01-20 NOTE — Progress Notes (Signed)
[place] - [place] - [place] - [place] - [place]   Follow-up Note  Referring Provider: Marlyn Corporal, [place] Primary Provider: Marlyn Corporal, PA Date of Office Visit: 01/20/2021  Subjective:   Melinda Mccarthy (DOB: 1986/01/29) is a 35 y.o. female who [facility] on 01/20/2021 in re-evaluation of the following:  HPI: [name] returns to this clinic in evaluation of asthma, allergic rhinitis, and oral allergy syndrome.  I last saw her in this clinic on 08 Jul 2020.  She has really done well since her last visit up until approximately 10 days ago.  Prior to that point in time she had very little issues with her nose and very little issues with her chest and she was able to taper off her combination inhaler yet still continues to use her nasal steroid on a pretty consistent basis.  She did not require systemic steroid or antibiotic for any type of airway issue.  She can exert herself without any problem and rarely used a short acting bronchodilator.  She can smell and taste with no problem.  However, about 10 days ago she had acute onset of some coughing and wheezing and she has had to restart her short acting bronchodilator and she restarted her combination inhaler.  She has had sinus pressure affecting her supraorbital region on the left and both cheeks.  And, she has also had more reflux that has developed during this episode and she has had to use some Tums.  She has burning up high in her chest.  She thinks that the pressure in her head might be a little bit better at this point but her chest still appears to be somewhat inflamed.  She is exposed to multiple individuals who may be infected as she works as a Engineer, civil (consulting) at [employer].  She remains away from consumption of fresh fruits given her oral allergy syndrome.  She has received 1 COVID-vaccine and has been infected with COVID on 2 occasions with her last infection being a minimal issue without any  long-term sequela.  She has received this year's flu vaccine.  Allergies as of 01/20/2021       Reactions   Lisinopril Cough   Sulfa Antibiotics         Medication List    AirDuo Digihaler 232-14 MCG/ACT Aepb Generic drug: Fluticasone-Salmeterol(sensor) INHALE ONE PUFF INTO LUNGS ONE TO TWO TIMES DAILY AS DIRECTED. RINSE, GARGLE AND SPIT AFTER USE.   albuterol 108 (90 Base) MCG/ACT inhaler Commonly known as: VENTOLIN HFA Inhale into the lungs every 6 (six) hours as needed for wheezing or shortness of breath.   albuterol (5 MG/ML) 0.5% nebulizer solution Commonly known as: PROVENTIL Take 2.5 mg by nebulization every 6 (six) hours as needed for wheezing or shortness of breath.   Auvi-Q 0.3 mg/0.3 mL Soaj injection Generic drug: EPINEPHrine Use as directed for life-threatening allergic reaction.   levocetirizine 5 MG tablet Commonly known as: XYZAL Take 5 mg by mouth every evening.   levothyroxine 75 MCG tablet Commonly known as: SYNTHROID Take 75 mcg by mouth daily.   metFORMIN 500 MG 24 hr tablet Commonly known as: GLUCOPHAGE-XR Take 500 mg by mouth 2 (two) times daily.   Xhance 93 MCG/ACT Exhu Generic drug: Fluticasone Propionate BLOW 2 DOSES IN EACH NOSTRIL ONE TO TWO TIMES DAILY AS DIRECTED.    Past Medical History:  Diagnosis Date   Asthma    Hemochromatosis carrier    Hypothyroid  PCOS (polycystic ovarian syndrome)    Pre-diabetes     Past Surgical History:  Procedure Laterality Date   CESAREAN SECTION  2016   CESAREAN SECTION  2019   WISDOM TOOTH EXTRACTION      Review of systems negative except as noted in HPI / PMHx or noted below:  Review of Systems  Constitutional: Negative.   HENT: Negative.    Eyes: Negative.   Respiratory: Negative.    Cardiovascular: Negative.   Gastrointestinal: Negative.   Genitourinary: Negative.   Musculoskeletal: Negative.   Skin: Negative.   Neurological: Negative.   Endo/Heme/Allergies: Negative.    Psychiatric/Behavioral: Negative.      Objective:   Vitals:   01/20/21 0815  BP: 132/88  Pulse: 88  Resp: 12  SpO2: 97%          Physical Exam Constitutional:      Appearance: She is not diaphoretic.  HENT:     Head: Normocephalic.     Right Ear: Tympanic membrane, ear canal and external ear normal.     Left Ear: Tympanic membrane, ear canal and external ear normal.     Nose: Nose normal. No mucosal edema or rhinorrhea.     Mouth/Throat:     Pharynx: Uvula midline. No oropharyngeal exudate.  Eyes:     Conjunctiva/sclera: Conjunctivae normal.  Neck:     Thyroid: No thyromegaly.     Trachea: Trachea normal. No tracheal tenderness or tracheal deviation.  Cardiovascular:     Rate and Rhythm: Normal rate and regular rhythm.     Heart sounds: Normal heart sounds, S1 normal and S2 normal. No murmur heard. Pulmonary:     Effort: No respiratory distress.     Breath sounds: Normal breath sounds. No stridor. No wheezing or rales.  Lymphadenopathy:     Head:     Right side of head: No tonsillar adenopathy.     Left side of head: No tonsillar adenopathy.     Cervical: No cervical adenopathy.  Skin:    Findings: No erythema or rash.     Nails: There is no clubbing.  Neurological:     Mental Status: She is alert.    Diagnostics:    Spirometry was performed and demonstrated an FEV1 of 3.43 at 93 % of predicted.  Assessment and Plan:   1. Not well controlled moderate persistent asthma   2. Perennial allergic rhinitis   3. Seasonal allergic rhinitis due to pollen   4. Seasonal allergic conjunctivitis   5. Pollen-food allergy, subsequent encounter     1.  Perform allergen avoidance measures as best as possible. Sports glasses while outdoors during spring  2.  Continue Air Duo 232 - 1 inhalation 1-2 times per day depending on disease activity.  Consistently use starting Valentine's Day 2023  3. Continue Xhance -2 sprays each nostril 1-2 times per day depending on  disease activity.  4.  If needed:   A. Albuterol HFA - 2 inhalations every 4-6 hours  B. Xyzal 5 mg - 1 tablet 1-2 times per day  C. Pataday Extra Strength- 1 drop each eye 1 time per day  D. Auvi-Q 0.3, benadryl, MD/ER evaluation for allergic reaction  5.  For this recent episode use the following:   A. Prednisone 10 mg tablet-1 tablet 1 time per day for 10 days B. Treat reflux with Omeprazole 40 mg - 1 tablet 1 time per day C. Mucinex DM D. Nasal saline E. ibuprofen  6. Return to clinic in  6 months or earlier if problem.  Lavona appears to have a viral respiratory tract infection that is giving rise to inflammation of her airway and we will treat her conservatively as noted above which includes a very low-dose of systemic steroids for a few days.  Interestingly, this has also flared up her reflux and we will treat her with a proton pump inhibitor during this timeframe.  I have encouraged her to be proactive regarding her pollen induced respiratory tract disease that appears to flare every spring.  I encouraged her to use her combination inhaler preventatively starting Valentine's Day and continue to use a nasal steroid.  Of course she would be a candidate for immunotherapy and she has been offered that form of therapy in the past.  Assuming she does well I will see her back in this clinic in 6 months or earlier if there is a problem.  [name], MD Allergy / Immunology [facility]

## 2021-01-20 NOTE — Patient Instructions (Addendum)
  1.  Perform allergen avoidance measures as best as possible. Sports glasses while outdoors during spring  2.  Continue Air Duo 232 - 1 inhalation 1-2 times per day depending on disease activity.  Consistently use starting Valentine's Day 2023  3. Continue Xhance -2 sprays each nostril 1-2 times per day depending on disease activity.  4.  If needed:   A. Albuterol HFA - 2 inhalations every 4-6 hours  B. Xyzal 5 mg - 1 tablet 1-2 times per day  C. Pataday Extra Strength- 1 drop each eye 1 time per day  D. Auvi-Q 0.3, benadryl, MD/ER evaluation for allergic reaction  5.  For this recent episode use the following:   A. Prednisone 10 mg tablet-1 tablet 1 time per day for 10 days B. Treat reflux with Omeprazole 40 mg - 1 tablet 1 time per day C. Mucinex DM D. Nasal saline E. ibuprofen  6. Return  to clinic in 6 months or earlier if problem.

## 2021-01-21 ENCOUNTER — Encounter: Payer: Self-pay | Admitting: Allergy and Immunology

## 2021-01-22 NOTE — Telephone Encounter (Signed)
Additional paperwork has been filled out and placed in Dr. Elmyra Ricks office to be reviewed and signed so that it can be faxed to insurance company.

## 2021-01-25 ENCOUNTER — Telehealth: Payer: Self-pay

## 2021-01-25 NOTE — Telephone Encounter (Signed)
Forms have been signed by Dr.Kim for xhance and faxed back to [employer] on 01/25/21. Forms have been labeled and place in pending.

## 2021-01-26 NOTE — Telephone Encounter (Signed)
PA is still pending.  

## 2021-02-03 NOTE — Telephone Encounter (Signed)
Please advise 

## 2021-02-03 NOTE — Telephone Encounter (Signed)
Faxed received Xhance denied- request does not meet the definition of medical necessity found in the members benefit booklet. The medication is approved when the [facility] a diagnosis of nasal polyps.

## 2021-02-04 NOTE — Telephone Encounter (Signed)
Forwarding

## 2021-02-04 NOTE — Telephone Encounter (Signed)
Called and spoke with patient and advised of PA denial. I asked if she has any issues receiving Xhance. She states that she receives the medication without extra cost because she is enrolled in an auto refill program. She is not having any coverage issues at this time. I advised that we will leave it like it is for now and if she runs into any issues to call us and let us know. Patient verbalized understanding.

## 2021-02-24 DIAGNOSIS — R519 Headache, unspecified: Secondary | ICD-10-CM | POA: Diagnosis not present

## 2021-02-24 DIAGNOSIS — Z1331 Encounter for screening for depression: Secondary | ICD-10-CM | POA: Diagnosis not present

## 2021-03-17 DIAGNOSIS — J3489 Other specified disorders of nose and nasal sinuses: Secondary | ICD-10-CM | POA: Diagnosis not present

## 2021-03-17 DIAGNOSIS — R519 Headache, unspecified: Secondary | ICD-10-CM | POA: Diagnosis not present

## 2021-05-28 DIAGNOSIS — E119 Type 2 diabetes mellitus without complications: Secondary | ICD-10-CM | POA: Diagnosis not present

## 2021-05-28 DIAGNOSIS — E785 Hyperlipidemia, unspecified: Secondary | ICD-10-CM | POA: Diagnosis not present

## 2021-05-28 DIAGNOSIS — R519 Headache, unspecified: Secondary | ICD-10-CM | POA: Diagnosis not present

## 2021-05-28 DIAGNOSIS — E039 Hypothyroidism, unspecified: Secondary | ICD-10-CM | POA: Diagnosis not present

## 2021-06-09 DIAGNOSIS — M7532 Calcific tendinitis of left shoulder: Secondary | ICD-10-CM | POA: Diagnosis not present

## 2021-07-02 DIAGNOSIS — R748 Abnormal levels of other serum enzymes: Secondary | ICD-10-CM | POA: Diagnosis not present

## 2021-07-21 ENCOUNTER — Ambulatory Visit: Payer: BC Managed Care – PPO | Admitting: Allergy and Immunology

## 2021-11-05 DIAGNOSIS — R002 Palpitations: Secondary | ICD-10-CM | POA: Diagnosis not present

## 2021-11-05 DIAGNOSIS — E119 Type 2 diabetes mellitus without complications: Secondary | ICD-10-CM | POA: Diagnosis not present

## 2021-11-05 DIAGNOSIS — J45909 Unspecified asthma, uncomplicated: Secondary | ICD-10-CM | POA: Diagnosis not present

## 2021-11-05 DIAGNOSIS — E785 Hyperlipidemia, unspecified: Secondary | ICD-10-CM | POA: Diagnosis not present

## 2021-11-05 DIAGNOSIS — E039 Hypothyroidism, unspecified: Secondary | ICD-10-CM | POA: Diagnosis not present

## 2021-11-26 DIAGNOSIS — I498 Other specified cardiac arrhythmias: Secondary | ICD-10-CM | POA: Diagnosis not present

## 2021-11-26 DIAGNOSIS — E782 Mixed hyperlipidemia: Secondary | ICD-10-CM | POA: Diagnosis not present

## 2021-11-26 DIAGNOSIS — R002 Palpitations: Secondary | ICD-10-CM | POA: Diagnosis not present

## 2021-12-08 DIAGNOSIS — R002 Palpitations: Secondary | ICD-10-CM | POA: Diagnosis not present

## 2021-12-16 DIAGNOSIS — R002 Palpitations: Secondary | ICD-10-CM | POA: Diagnosis not present

## 2022-01-14 DIAGNOSIS — R002 Palpitations: Secondary | ICD-10-CM | POA: Diagnosis not present

## 2022-02-11 DIAGNOSIS — E039 Hypothyroidism, unspecified: Secondary | ICD-10-CM | POA: Diagnosis not present

## 2022-02-11 DIAGNOSIS — E785 Hyperlipidemia, unspecified: Secondary | ICD-10-CM | POA: Diagnosis not present

## 2022-02-11 DIAGNOSIS — R519 Headache, unspecified: Secondary | ICD-10-CM | POA: Diagnosis not present

## 2022-02-11 DIAGNOSIS — E1169 Type 2 diabetes mellitus with other specified complication: Secondary | ICD-10-CM | POA: Diagnosis not present

## 2022-04-22 DIAGNOSIS — Z6834 Body mass index (BMI) 34.0-34.9, adult: Secondary | ICD-10-CM | POA: Diagnosis not present

## 2022-04-22 DIAGNOSIS — J329 Chronic sinusitis, unspecified: Secondary | ICD-10-CM | POA: Diagnosis not present

## 2022-04-25 DIAGNOSIS — R051 Acute cough: Secondary | ICD-10-CM | POA: Diagnosis not present

## 2022-05-13 DIAGNOSIS — E039 Hypothyroidism, unspecified: Secondary | ICD-10-CM | POA: Diagnosis not present

## 2022-05-13 DIAGNOSIS — R519 Headache, unspecified: Secondary | ICD-10-CM | POA: Diagnosis not present

## 2022-05-13 DIAGNOSIS — E1169 Type 2 diabetes mellitus with other specified complication: Secondary | ICD-10-CM | POA: Diagnosis not present

## 2022-05-13 DIAGNOSIS — E785 Hyperlipidemia, unspecified: Secondary | ICD-10-CM | POA: Diagnosis not present

## 2022-06-09 ENCOUNTER — Other Ambulatory Visit: Payer: Self-pay | Admitting: Allergy and Immunology

## 2022-06-09 ENCOUNTER — Ambulatory Visit: Payer: BC Managed Care – PPO | Admitting: Allergy and Immunology

## 2022-06-09 VITALS — BP 116/76 | HR 88 | Temp 98.3°F | Resp 12

## 2022-06-09 DIAGNOSIS — H1013 Acute atopic conjunctivitis, bilateral: Secondary | ICD-10-CM | POA: Diagnosis not present

## 2022-06-09 DIAGNOSIS — J301 Allergic rhinitis due to pollen: Secondary | ICD-10-CM | POA: Diagnosis not present

## 2022-06-09 DIAGNOSIS — J3089 Other allergic rhinitis: Secondary | ICD-10-CM | POA: Diagnosis not present

## 2022-06-09 DIAGNOSIS — T781XXD Other adverse food reactions, not elsewhere classified, subsequent encounter: Secondary | ICD-10-CM

## 2022-06-09 DIAGNOSIS — J4540 Moderate persistent asthma, uncomplicated: Secondary | ICD-10-CM

## 2022-06-09 DIAGNOSIS — H1010 Acute atopic conjunctivitis, unspecified eye: Secondary | ICD-10-CM

## 2022-06-09 DIAGNOSIS — R442 Other hallucinations: Secondary | ICD-10-CM

## 2022-06-09 DIAGNOSIS — K9049 Malabsorption due to intolerance, not elsewhere classified: Secondary | ICD-10-CM

## 2022-06-09 DIAGNOSIS — J324 Chronic pansinusitis: Secondary | ICD-10-CM

## 2022-06-09 MED ORDER — FAMOTIDINE 40 MG PO TABS: 40.0000 mg | ORAL_TABLET | Freq: Every day | ORAL | 5 refills | Status: DC

## 2022-06-09 MED ORDER — BUDESONIDE-FORMOTEROL FUMARATE 160-4.5 MCG/ACT IN AERO: 2.0000 | INHALATION_SPRAY | Freq: Two times a day (BID) | RESPIRATORY_TRACT | 5 refills | Status: DC

## 2022-06-09 MED ORDER — AIRSUPRA 90-80 MCG/ACT IN AERO: 2.0000 | INHALATION_SPRAY | RESPIRATORY_TRACT | 2 refills | Status: DC | PRN

## 2022-06-09 MED ORDER — OMEPRAZOLE 40 MG PO CPDR: 40.0000 mg | DELAYED_RELEASE_CAPSULE | Freq: Every morning | ORAL | 5 refills | Status: DC

## 2022-06-09 MED ORDER — XHANCE 93 MCG/ACT NA EXHU: INHALANT_SUSPENSION | NASAL | 5 refills | Status: DC

## 2022-06-09 NOTE — Patient Instructions (Addendum)
  1.  Perform allergen avoidance measures as best as possible. Sports glasses while outdoors during spring  2.  Treat and prevent inflammation:  A. Symbicort 160  - 2 inhalation 2 times per day w/spacer (empty lungs)  B. Xhance -2 sprays each nostril 1-2 times per day   C. Prednisone 10 mg - 1 tablet 1 time per day for 10 days only  3. Treat and prevent reflux / LPR:   A. Omeprazole 40 mg - 1 tablet in AM  B. Famotidine 40 mg - 1 tablet in PM  4.  If needed:   A. AirSupra - 2 inhalations every 4-6 hours (Coupon)  B. Xyzal 5 mg - 1 tablet 1-2 times per day  C. Pataday - 1 drop each eye 1 time per day  D. Auvi-Q 0.3, benadryl, MD/ER evaluation for allergic reaction  5. Obtain sinus CT scan for chronic sinusitis  6. Return  to clinic in 4 weeks or earlier if problem.

## 2022-06-09 NOTE — Progress Notes (Signed)
[place] - [place] - [place] - [place] - [place]   Follow-up Note  Referring Provider: Marlyn Corporal, [place] Primary Provider: Doran Stabler, NP Date of Office Visit: 06/09/2022  Subjective:   Melinda Mccarthy (DOB: 01-Jul-1985) is a 37 Mccarthy.o. female who [facility] on 06/09/2022 in re-evaluation of the following:  HPI: [name] presents to this clinic in evaluation of asthma, allergic rhinitis, oral allergy syndrome.  She was last seen in this clinic 20 January 2021.  She has had a very difficult time with her respiratory tract since January 2024.  She states that she has had lots of problems with her nose with nasal congestion and sneezing and congestion and she has required 2 antibiotics and an injection of Rocephin and a Kenalog injection to get this under better control.  Even though her head issue has improved she still has some congestion and now she has phantosmia with a chemical smell.  In addition, she has lots of problems with her asthma and is using her bronchodilator on a daily basis.  In addition, she has been having lots of drainage in her throat and lots of uncontrollable coughing with some micturation and she still continues to bring up junk from her throat and her throat hurts.  She is having reflux with burning up into her throat for which she uses Tums.  She does not really consume any caffeine but she does eat chocolate.  She remains away from apples and peaches because of her oral allergy syndrome.  Allergies as of 06/09/2022       Reactions   Lisinopril Cough   Other    Apples and peaches   Sulfa Antibiotics         Medication List    albuterol 108 (90 Base) MCG/ACT inhaler Commonly known as: VENTOLIN HFA Inhale into the lungs every 6 (six) hours as needed for wheezing or shortness of breath.   albuterol (5 MG/ML) 0.5% nebulizer solution Commonly known as: PROVENTIL Take 2.5 mg by nebulization every 6 (six) hours  as needed for wheezing or shortness of breath.   Auvi-Q 0.3 mg/0.3 mL Soaj injection Generic drug: EPINEPHrine Use as directed for life-threatening allergic reaction.   levocetirizine 5 MG tablet Commonly known as: XYZAL Take 5 mg by mouth every evening.   levothyroxine 75 MCG tablet Commonly known as: SYNTHROID Take 75 mcg by mouth daily.   metFORMIN 500 MG 24 hr tablet Commonly known as: GLUCOPHAGE-XR Take 500 mg by mouth 2 (two) times daily.   Ozempic (2 MG/DOSE) 8 MG/3ML Sopn Generic drug: Semaglutide (2 MG/DOSE) Inject 2 mg into the skin once a week.   SUMAtriptan 100 MG tablet Commonly known as: IMITREX SMARTSIG:0.5-1 Tablet(s) By Mouth 1-2 Times Daily PRN   topiramate 25 MG tablet Commonly known as: TOPAMAX Take by mouth.   Xhance 93 MCG/ACT Exhu Generic drug: Fluticasone Propionate BLOW 2 DOSES IN EACH NOSTRIL ONE TO TWO TIMES DAILY AS DIRECTED.    Past Medical History:  Diagnosis Date   Asthma    Hemochromatosis carrier    Hypothyroid    PCOS (polycystic ovarian syndrome)    Pre-diabetes     Past Surgical History:  Procedure Laterality Date   CESAREAN SECTION  2016   CESAREAN SECTION  2019   WISDOM TOOTH EXTRACTION      Review of systems negative except as noted in HPI / PMHx or noted below:  Review of Systems  Constitutional: Negative.  HENT: Negative.    Eyes: Negative.   Respiratory: Negative.    Cardiovascular: Negative.   Gastrointestinal: Negative.   Genitourinary: Negative.   Musculoskeletal: Negative.   Skin: Negative.   Neurological: Negative.   Endo/Heme/Allergies: Negative.   Psychiatric/Behavioral: Negative.       Objective:   Vitals:   06/09/22 1604  BP: 116/76  Pulse: 88  Resp: 12  Temp: 98.3 F (36.8 C)  SpO2: 99%          Physical Exam Constitutional:      Appearance: She is not diaphoretic.  HENT:     Head: Normocephalic.     Right Ear: Tympanic membrane, ear canal and external ear normal.     Left  Ear: Tympanic membrane, ear canal and external ear normal.     Nose: Nose normal. No mucosal edema or rhinorrhea.     Mouth/Throat:     Pharynx: Uvula midline. No oropharyngeal exudate.  Eyes:     Conjunctiva/sclera: Conjunctivae normal.  Neck:     Thyroid: No thyromegaly.     Trachea: Trachea normal. No tracheal tenderness or tracheal deviation.  Cardiovascular:     Rate and Rhythm: Normal rate and regular rhythm.     Heart sounds: Normal heart sounds, S1 normal and S2 normal. No murmur heard. Pulmonary:     Effort: No respiratory distress.     Breath sounds: Normal breath sounds. No stridor. No wheezing or rales.  Lymphadenopathy:     Head:     Right side of head: No tonsillar adenopathy.     Left side of head: No tonsillar adenopathy.     Cervical: No cervical adenopathy.  Skin:    Findings: No erythema or rash.     Nails: There is no clubbing.  Neurological:     Mental Status: She is alert.     Diagnostics:    Spirometry was performed and demonstrated an FEV1 of 3.44 at 94 % of predicted.  Assessment and Plan:   1. Not well controlled moderate persistent asthma   2. Perennial allergic rhinitis   3. Seasonal allergic rhinitis due to pollen   4. Seasonal allergic conjunctivitis   5. Pollen-food allergy, subsequent encounter   6. Chronic pansinusitis   7. Phantosmia    1.  Perform allergen avoidance measures as best as possible. Sports glasses while outdoors during spring  2.  Treat and prevent inflammation:  A. Symbicort 160  - 2 inhalation 2 times per day w/spacer (empty lungs)  B. Xhance -2 sprays each nostril 1-2 times per day   C. Prednisone 10 mg - 1 tablet 1 time per day for 10 days only  3. Treat and prevent reflux / LPR:   A. Omeprazole 40 mg - 1 tablet in AM  B. Famotidine 40 mg - 1 tablet in PM  4.  If needed:   A. AirSupra - 2 inhalations every 4-6 hours (Coupon)  B. Xyzal 5 mg - 1 tablet 1-2 times per day  C. Pataday - 1 drop each eye 1 time  per day  D. Auvi-Q 0.3, benadryl, MD/ER evaluation for allergic reaction  5. Obtain sinus CT scan for chronic sinusitis  6. Return  to clinic in 4 weeks or earlier if problem.  It looks like [name] has a collection of disease states giving rise to respiratory tract symptoms including her atopic disease, possible component of chronic sinusitis giving rise to phantosmia, and reflux induced respiratory disease for which we will have her undergo  the plan noted above to address these issues.  I will see her back in this clinic in 4 weeks or earlier if there is a problem.  [name], MD Allergy / Immunology [facility]

## 2022-06-10 NOTE — Telephone Encounter (Signed)
Can we attempt a PA for Airsupra please.

## 2022-06-13 ENCOUNTER — Encounter: Payer: Self-pay | Admitting: Allergy and Immunology

## 2022-06-13 NOTE — Telephone Encounter (Signed)
Has patient failed Albuterol Hfa?

## 2022-06-14 ENCOUNTER — Telehealth: Payer: Self-pay | Admitting: *Deleted

## 2022-06-14 NOTE — Telephone Encounter (Signed)
No reference number provided all you will need to provide is member ID [id number]. Number to call is [phone].

## 2022-06-14 NOTE — Telephone Encounter (Addendum)
Initiated Authorization for Sinus CT on Friday 4/5 and I called to check status today and they have reviewed provider notes and are requesting a peer to peer review which requires Dr. Lucie Leather to call [phone]. Please advise.

## 2022-06-14 NOTE — Telephone Encounter (Signed)
Yes mam they have tried and failed Albuterol.

## 2022-06-15 ENCOUNTER — Telehealth: Payer: Self-pay

## 2022-06-15 NOTE — Telephone Encounter (Signed)
PA request received via provider for Airsupra 90-80MCG/ACT aerosol  PA has been submitted to [facility] and is pending additional questions/determination  Key: [id number]

## 2022-06-15 NOTE — Telephone Encounter (Signed)
PA has been submitted and is pending determination, will updated in additional encounter created.  

## 2022-06-21 NOTE — Telephone Encounter (Signed)
Patient called wanting an update regarding the scheduling for her CT Scan.

## 2022-06-22 NOTE — Telephone Encounter (Signed)
Called and informed patient of Dr. Kathyrn Lass message. She said that Fridays work best for scheduling the CT.

## 2022-06-22 NOTE — Telephone Encounter (Signed)
Dr .Kozlow, please advise.  

## 2022-06-23 NOTE — Telephone Encounter (Signed)
[facility] has already picked up the AirSupra. The coupon overrode the need for a PA.

## 2022-06-23 NOTE — Telephone Encounter (Signed)
PA has been DENIED, no additional information provided at this time.  

## 2022-06-29 NOTE — Telephone Encounter (Signed)
Called and informed patient that she is scheduled for her sinus ct scan at [facility] on Friday, April 26th, needing to check-in at the Outpatient Center at 8:00am. Faxed requisition to [facility]   Note:  Insurance authorization number: [id number]

## 2022-06-29 NOTE — Telephone Encounter (Signed)
Patient called and states she received a letter that her CT scan is authorized through 5/4. She hasn't heard an update regarding this so she was just following up.

## 2022-07-01 DIAGNOSIS — J341 Cyst and mucocele of nose and nasal sinus: Secondary | ICD-10-CM | POA: Diagnosis not present

## 2022-07-01 DIAGNOSIS — J324 Chronic pansinusitis: Secondary | ICD-10-CM | POA: Diagnosis not present

## 2022-07-06 ENCOUNTER — Encounter: Payer: Self-pay | Admitting: Allergy and Immunology

## 2022-07-06 ENCOUNTER — Ambulatory Visit: Payer: BC Managed Care – PPO | Admitting: Allergy and Immunology

## 2022-07-06 VITALS — BP 134/88 | HR 80 | Resp 14 | Ht 68.5 in | Wt 223.2 lb

## 2022-07-06 DIAGNOSIS — J3089 Other allergic rhinitis: Secondary | ICD-10-CM

## 2022-07-06 DIAGNOSIS — J301 Allergic rhinitis due to pollen: Secondary | ICD-10-CM | POA: Diagnosis not present

## 2022-07-06 DIAGNOSIS — T781XXD Other adverse food reactions, not elsewhere classified, subsequent encounter: Secondary | ICD-10-CM

## 2022-07-06 DIAGNOSIS — H1013 Acute atopic conjunctivitis, bilateral: Secondary | ICD-10-CM

## 2022-07-06 DIAGNOSIS — J4540 Moderate persistent asthma, uncomplicated: Secondary | ICD-10-CM | POA: Diagnosis not present

## 2022-07-06 DIAGNOSIS — H1010 Acute atopic conjunctivitis, unspecified eye: Secondary | ICD-10-CM

## 2022-07-06 DIAGNOSIS — G43909 Migraine, unspecified, not intractable, without status migrainosus: Secondary | ICD-10-CM

## 2022-07-06 MED ORDER — RYALTRIS 665-25 MCG/ACT NA SUSP: NASAL | 5 refills | Status: DC

## 2022-07-06 NOTE — Patient Instructions (Addendum)
  1.  Perform allergen avoidance measures as best as possible. Sports glasses while outdoors during spring  2.  Treat and prevent inflammation:  A. Symbicort 160  - 2 inhalation 1-2 times per day w/spacer   B. Ryaltris - 2 sprays each nostril 1-2 times per day (SP)  3. Treat and prevent reflux / LPR:   A. Avoid caffeine and chocolate  4.  If needed:   A. AirSupra - 2 inhalations every 4-6 hours (Coupon)  B. Xyzal 5 mg - 1 tablet 1-2 times per day  C. Pataday - 1 drop each eye 1 time per day  D. Auvi-Q 0.3, benadryl, MD/ER evaluation for allergic reaction  5. Referral to neurology for migraine  6. Return to clinic in 6 months or earlier if problem.

## 2022-07-06 NOTE — Progress Notes (Signed)
[facility] - [place] [facility] [place] [facility] [place] -    Follow-up Note  Referring Provider: Doran Mccarthy, * Primary Provider: Doran Stabler, NP Date of Office Visit: 07/06/2022  Subjective:   Melinda Mccarthy (DOB: [birth year]) is a 37 y.o. female who [facility] 07[facility]/2022 in re-evaluation of the following:  HPI: Melinda Mccarthy returns to the clinic in evaluation of asthma, allergic rhinitis, phantosmia, oral allergy syndrome.  I last saw her in this clinic for April 2024.  She is doing pretty well regarding her asthma at this point in time does not even need to use any short acting bronchodilator.  She continues on Symbicort.  She still has some nasal congestion especially in the morning and she still occasionally has some sneezing but her phantosmia has resolved.  She is having some problem requiring her Xhance nasal spray.  She is not having any problems with reflux at this point in time.  She is not using a proton pump inhibitor or H2 receptor blocker which she has dramatically changed her diet and really does not have any caffeine or chocolate or sweets.  She has headaches.  She still has throbbing headaches associated with a "shield" of her right eye with photophobia that requires her to lay down with an ice pack on her head and take an Imitrex.  Her frequency is several times per week.  She has been stuck in this pattern for ever.  She has fractured sleep at night but she does not appear to have any apneic symptoms.  Allergies as of 07/06/2022       Reactions   Lisinopril Cough   Other    Apples and peaches   Sulfa Antibiotics         Medication List    Airsupra 90-80 MCG/ACT Aero Generic drug: Albuterol-Budesonide TAKE 2 PUFFS BY MOUTH EVERY 4 HOURS AS NEEDED   albuterol 108 (90 Base) MCG/ACT inhaler Commonly known as: VENTOLIN HFA Inhale into the lungs every 6 (six) hours as needed for wheezing or shortness of  breath.   albuterol (5 MG/ML) 0.5% nebulizer solution Commonly known as: PROVENTIL Take 2.5 mg by nebulization every 6 (six) hours as needed for wheezing or shortness of breath.   Auvi-Q 0.3 mg/0.3 mL Soaj injection Generic drug: EPINEPHrine Use as directed for life-threatening allergic reaction.   budesonide-formoterol 160-4.5 MCG/ACT inhaler Commonly known as: Symbicort Inhale 2 puffs into the lungs 2 (two) times daily.   famotidine 40 MG tablet Commonly known as: PEPCID Take 1 tablet (40 mg total) by mouth at bedtime.   levocetirizine 5 MG tablet Commonly known as: XYZAL Take 5 mg by mouth every evening.   levothyroxine 75 MCG tablet Commonly known as: SYNTHROID Take 75 mcg by mouth daily.   metFORMIN 500 MG 24 hr tablet Commonly known as: GLUCOPHAGE-XR Take 500 mg by mouth 2 (two) times daily.   omeprazole 40 MG capsule Commonly known as: PRILOSEC Take 1 capsule (40 mg total) by mouth in the morning.   Ozempic (2 MG/DOSE) 8 MG/3ML Sopn Generic drug: Semaglutide (2 MG/DOSE) Inject 2 mg into the skin once a week.   SUMAtriptan 100 MG tablet Commonly known as: IMITREX SMARTSIG:0.5-1 Tablet(s) By Mouth 1-2 Times Daily PRN   topiramate 25 MG tablet Commonly known as: TOPAMAX Take by mouth.   Xhance 93 MCG/ACT Exhu Generic drug: Fluticasone Propionate BLOW 2 DOSES IN EACH NOSTRIL ONE TO TWO TIMES DAILY AS DIRECTED.    Past  Medical History:  Diagnosis Date  . Asthma   . Hemochromatosis carrier   . Hypothyroid   . PCOS (polycystic ovarian syndrome)   . Pre-diabetes     Past Surgical History:  Procedure Laterality Date  . CESAREAN SECTION  2016  . CESAREAN SECTION  2019  . WISDOM TOOTH EXTRACTION      Review of systems negative except as noted in HPI / PMHx or noted below:  Review of Systems  Constitutional: Negative.   HENT: Negative.    Eyes: Negative.   Respiratory: Negative.    Cardiovascular: Negative.   Gastrointestinal: Negative.    Genitourinary: Negative.   Musculoskeletal: Negative.   Skin: Negative.   Neurological: Negative.   Endo/Heme/Allergies: Negative.   Psychiatric/Behavioral: Negative.       Objective:   Vitals:   07/06/22 1037  BP: 134/88  Pulse: 80  Resp: 14  SpO2: 99%   Height: 5' 8.5" (174 cm)  Weight: 223 lb 3.2 oz (101.2 kg)   Physical Exam Constitutional:      Appearance: She is not diaphoretic.  HENT:     Head: Normocephalic.     Right Ear: Tympanic membrane, ear canal and external ear normal.     Left Ear: Tympanic membrane, ear canal and external ear normal.     Nose: Nose normal. No mucosal edema or rhinorrhea.     Mouth/Throat:     Pharynx: Uvula midline. No oropharyngeal exudate.  Eyes:     Conjunctiva/sclera: Conjunctivae normal.  Neck:     Thyroid: No thyromegaly.     Trachea: Trachea normal. No tracheal tenderness or tracheal deviation.  Cardiovascular:     Rate and Rhythm: Normal rate and regular rhythm.     Heart sounds: Normal heart sounds, S1 normal and S2 normal. No murmur heard. Pulmonary:     Effort: No respiratory distress.     Breath sounds: Normal breath sounds. No stridor. No wheezing or rales.  Lymphadenopathy:     Head:     Right side of head: No tonsillar adenopathy.     Left side of head: No tonsillar adenopathy.     Cervical: No cervical adenopathy.  Skin:    Findings: No erythema or rash.     Nails: There is no clubbing.  Neurological:     Mental Status: She is alert.    Diagnostics:    Spirometry was performed and demonstrated an FEV1 of 3.44 at 94 % of predicted.  Results of a sinus CT scan obtained 30 June 2022 identified no significant sinus abnormalities.  Assessment and Plan:   1. Asthma, moderate persistent, well-controlled   2. Perennial allergic rhinitis   3. Seasonal allergic rhinitis due to pollen   4. Seasonal allergic conjunctivitis   5. Pollen-food allergy, subsequent encounter   6. Migraine syndrome    1.  Perform  allergen avoidance measures as best as possible. Sports glasses while outdoors during spring  2.  Treat and prevent inflammation:  A. Symbicort 160  - 2 inhalation 1-2 times per day w/spacer   B. Ryaltris - 2 sprays each nostril 1-2 times per day (SP)  3. Treat and prevent reflux / LPR:   A. Avoid caffeine and chocolate  4.  If needed:   A. AirSupra - 2 inhalations every 4-6 hours (Coupon)  B. Xyzal 5 mg - 1 tablet 1-2 times per day  C. Pataday - 1 drop each eye 1 time per day  D. Auvi-Q 0.3, benadryl, MD/ER evaluation for allergic  reaction  5. Referral to neurology for migraine  6. Return  to clinic in 6 months or earlier if problem.  [name], MD Allergy / Immunology [facility]

## 2022-07-07 ENCOUNTER — Encounter: Payer: Self-pay | Admitting: Allergy and Immunology

## 2022-07-29 DIAGNOSIS — R519 Headache, unspecified: Secondary | ICD-10-CM | POA: Diagnosis not present

## 2022-07-29 DIAGNOSIS — R0981 Nasal congestion: Secondary | ICD-10-CM | POA: Diagnosis not present

## 2022-07-29 DIAGNOSIS — J342 Deviated nasal septum: Secondary | ICD-10-CM | POA: Diagnosis not present

## 2022-07-29 DIAGNOSIS — J45909 Unspecified asthma, uncomplicated: Secondary | ICD-10-CM | POA: Diagnosis not present

## 2022-09-16 DIAGNOSIS — E785 Hyperlipidemia, unspecified: Secondary | ICD-10-CM | POA: Diagnosis not present

## 2022-09-16 DIAGNOSIS — Z6832 Body mass index (BMI) 32.0-32.9, adult: Secondary | ICD-10-CM | POA: Diagnosis not present

## 2022-09-16 DIAGNOSIS — E039 Hypothyroidism, unspecified: Secondary | ICD-10-CM | POA: Diagnosis not present

## 2022-09-16 DIAGNOSIS — E1169 Type 2 diabetes mellitus with other specified complication: Secondary | ICD-10-CM | POA: Diagnosis not present

## 2022-10-26 ENCOUNTER — Encounter: Payer: Self-pay | Admitting: Neurology

## 2022-10-26 ENCOUNTER — Ambulatory Visit: Payer: BC Managed Care – PPO | Admitting: Neurology

## 2022-10-26 ENCOUNTER — Other Ambulatory Visit: Payer: Self-pay | Admitting: Neurology

## 2022-10-26 VITALS — BP 132/82 | HR 65 | Ht 69.0 in | Wt 221.0 lb

## 2022-10-26 DIAGNOSIS — G43009 Migraine without aura, not intractable, without status migrainosus: Secondary | ICD-10-CM

## 2022-10-26 MED ORDER — NURTEC 75 MG PO TBDP
75.0000 mg | ORAL_TABLET | Freq: Every day | ORAL | 11 refills | Status: DC | PRN
Start: 2022-10-26 — End: 2022-11-10

## 2022-10-26 MED ORDER — TOPIRAMATE 100 MG PO TABS
100.0000 mg | ORAL_TABLET | Freq: Every day | ORAL | 3 refills | Status: DC
Start: 2022-10-26 — End: 2023-11-14

## 2022-10-26 NOTE — Progress Notes (Signed)
[facility]    Provider:  Dr Lucia Gaskins Requesting Provider: Jessica Priest, MD Primary Care Provider:  Doran Stabler, NP  CC:  migraines  HPI:  Melinda Mccarthy is a 37 y.o. female here as requested by Jessica Priest, MD for migraines.has Abnormal findings on antenatal screening of mother; [redacted] weeks gestation of pregnancy; Abnormal first trimester screen; Encounter for fetal anatomic survey; [redacted] weeks gestation of pregnancy; [redacted] weeks gestation of pregnancy; [redacted] weeks gestation of pregnancy; Excessive fetal growth affecting management of mother, antepartum; Diet controlled gestational diabetes mellitus in third trimester; Seasonal and perennial allergic rhinitis; Allergic conjunctivitis of both eyes; Moderate persistent asthma without complication; Pollen-food allergy; Chronic sinusitis; Vaccine counseling; and Gastroesophageal reflux disease on their problem list.   She has migraines them "forever". No known family history. They were awful as a child, she would vomit, she has had an MRI, CTs of the head all have been normal. Most commonly over the left eye, pounding, intense, nausea, photophobia, no vomiting. She can get them on the right as well. The right side feels heavier when the migraines are on the right side and when severe. Ibuprofen helps a little sometimes. Sumatriptan helps sometimes. About a year ago have MRi brain due to headache and was started on sumatriptan. MRI reportedly normal, Untreated would last all day or even treated with ibuprofen or imitrex may not work. Worse during period. She started a headache log. 5 moderate to severe migraines a month and less than 10 total headache days a month for at least a year. Majority of the headaches are during period. No aura. Migraines can last 24 hours. No other focal neurologic deficits, associated symptoms, inciting events or modifiable factors.  Tried: sumatriptan, Rizatriptan, Topiramate, cannot take propranolol due  to asthma,   Review of Systems: Patient complains of symptoms per HPI as well as the following symptoms none. Pertinent negatives and positives per HPI. All others negative.   Social History   Socioeconomic History   Marital status: Married    Spouse name: Not on file   Number of children: Not on file   Years of education: Not on file   Highest education level: Not on file  Occupational History   Not on file  Tobacco Use   Smoking status: Never   Smokeless tobacco: Never  Vaping Use   Vaping status: Never Used  Substance and Sexual Activity   Alcohol use: Yes   Drug use: No   Sexual activity: Yes  Other Topics Concern   Not on file  Social History Narrative   Not on file   Social Determinants of Health   Financial Resource Strain: Not on file  Food Insecurity: Not on file  Transportation Needs: Not on file  Physical Activity: Not on file  Stress: Not on file  Social Connections: Not on file  Intimate Partner Violence: Not on file    Family History  Problem Relation Age of Onset   Hypertension Mother    Hypothyroidism Mother    Bladder Cancer Maternal Grandfather    Breast cancer Paternal Grandmother    Diabetes Paternal Grandfather    Migraines Neg Hx     Past Medical History:  Diagnosis Date   Asthma    Hemochromatosis carrier    Hypothyroid    PCOS (polycystic ovarian syndrome)    Pre-diabetes     Patient Active Problem List   Diagnosis Date Noted   Gastroesophageal reflux disease 12/13/2019   Seasonal and perennial  allergic rhinitis 07/12/2019   Allergic conjunctivitis of both eyes 07/12/2019   Moderate persistent asthma without complication 07/12/2019   Pollen-food allergy 07/12/2019   Chronic sinusitis 07/12/2019   Vaccine counseling 07/12/2019   [redacted] weeks gestation of pregnancy    Excessive fetal growth affecting management of mother, antepartum    Diet controlled gestational diabetes mellitus in third trimester    [redacted] weeks gestation of  pregnancy    Abnormal first trimester screen    Encounter for fetal anatomic survey    [redacted] weeks gestation of pregnancy    Abnormal findings on antenatal screening of mother 02/26/2014   [redacted] weeks gestation of pregnancy 02/26/2014    Past Surgical History:  Procedure Laterality Date   CESAREAN SECTION  2016   CESAREAN SECTION  2019   WISDOM TOOTH EXTRACTION      Current Outpatient Medications  Medication Sig Dispense Refill   albuterol (PROVENTIL) (5 MG/ML) 0.5% nebulizer solution Take 2.5 mg by nebulization as needed for wheezing or shortness of breath.     albuterol (VENTOLIN HFA) 108 (90 Base) MCG/ACT inhaler Inhale into the lungs every 6 (six) hours as needed for wheezing or shortness of breath.     Albuterol-Budesonide (AIRSUPRA) 90-80 MCG/ACT AERO TAKE 2 PUFFS BY MOUTH EVERY 4 HOURS AS NEEDED 10.7 g 2   AUVI-Q 0.3 MG/0.3ML SOAJ injection Use as directed for life-threatening allergic reaction. 1 each 3   budesonide-formoterol (SYMBICORT) 160-4.5 MCG/ACT inhaler Inhale 2 puffs into the lungs 2 (two) times daily. 1 each 5   levocetirizine (XYZAL) 5 MG tablet Take 5 mg by mouth every evening.     levothyroxine (SYNTHROID) 75 MCG tablet Take 75 mcg by mouth daily.     metFORMIN (GLUCOPHAGE-XR) 500 MG 24 hr tablet Take 500 mg by mouth 2 (two) times daily.     OZEMPIC, 2 MG/DOSE, 8 MG/3ML SOPN Inject 2 mg into the skin once a week.     Rimegepant Sulfate (NURTEC) 75 MG TBDP Take 1 tablet (75 mg total) by mouth daily as needed. For migraines. Take as close to onset of migraine as possible. One daily maximum. Please use Copay Card: BIN [id number] PCN CN GRP [id number] ID [id number] 16 tablet 11   SUMAtriptan (IMITREX) 100 MG tablet SMARTSIG:0.5-1 Tablet(s) By Mouth 1-2 Times Daily PRN     topiramate (TOPAMAX) 100 MG tablet Take 1 tablet (100 mg total) by mouth at bedtime. 90 tablet 3   No current facility-administered medications for this visit.    Allergies as of 10/26/2022 - Review  Complete 10/26/2022  Allergen Reaction Noted   Lisinopril Cough 01/20/2021   Other  06/09/2022   Sulfa antibiotics  04/02/2014    Vitals: BP 132/82   Pulse 65   Ht 5\' 9"  (1.753 m)   Wt 221 lb (100.2 kg)   BMI 32.64 kg/m  Last Weight:  Wt Readings from Last 1 Encounters:  10/26/22 221 lb (100.2 kg)   Last Height:   Ht Readings from Last 1 Encounters:  10/26/22 5\' 9"  (1.753 m)     Physical exam: Exam: Gen: NAD, conversant, well nourised, obese, well groomed                     CV: RRR, no MRG. No Carotid Bruits. No peripheral edema, warm, nontender Eyes: Conjunctivae clear without exudates or hemorrhage  Neuro: Detailed Neurologic Exam  Speech:    Speech is normal; fluent and spontaneous with normal comprehension.  Cognition:  The patient is oriented to person, place, and time;     recent and remote memory intact;     language fluent;     normal attention, concentration,     fund of knowledge Cranial Nerves:    The pupils are equal, round, and reactive to light. The fundi are normal and spontaneous venous pulsations are present. Visual fields are full to finger confrontation. Extraocular movements are intact. Trigeminal sensation is intact and the muscles of mastication are normal. The face is symmetric. The palate elevates in the midline. Hearing intact. Voice is normal. Shoulder shrug is normal. The tongue has normal motion without fasciculations.   Coordination:    Normal finger to nose and heel to shin. Normal rapid alternating movements.   Gait:    Heel-toe and tandem gait are normal.   Motor Observation:    No asymmetry, no atrophy, and no involuntary movements noted. Tone:    Normal muscle tone.    Posture:    Posture is normal. normal erect    Strength:    Strength is V/V in the upper and lower limbs.      Sensation: intact to LT     Reflex Exam:  DTR's:    Deep tendon reflexes in the upper and lower extremities are normal bilaterally.    Toes:    The toes are downgoing bilaterally.   Clonus:    Clonus is absent.    Assessment/Plan:  Patient with migraines, menstrually related  We have a great new class of medications CGRP: Emgality, Ajovy, Qulipta, Vyepti, [place], Nurtec. (Aimovig) But you have to fail at least 3 classes of meds either side effect of have to try 3 months: usually Topiramate or Depakote, Propanolol(has asthma) or other BP medication such as calcium channel blocker,  and also TCA nortriptyline or instead sometimes effexor counts instead of TCA.    Nurtec: Can take it as needed. Tried: sumatriptan, Rizatriptan, Topiramate, cannot take propranolol due to asthma,  Increase Topiramate to 100mg  at bedtime. Next we could try propranolol 10mg  twice daily to 20mg  twice daily. Next we could try Nortriptyline 10mg  at bedtime.   No orders of the defined types were placed in this encounter.  Meds ordered this encounter  Medications   Rimegepant Sulfate (NURTEC) 75 MG TBDP    Sig: Take 1 tablet (75 mg total) by mouth daily as needed. For migraines. Take as close to onset of migraine as possible. One daily maximum. Please use Copay Card: BIN [id number] PCN CN GRP [id number] ID [id number]    Dispense:  16 tablet    Refill:  11    Please use Copay Card: BIN [id number] PCN CN GRP [id number] ID [id number]   topiramate (TOPAMAX) 100 MG tablet    Sig: Take 1 tablet (100 mg total) by mouth at bedtime.    Dispense:  90 tablet    Refill:  3    Cc: Kozlow, Alvira Philips, MD,  Doran Stabler, NP  Naomie Dean, MD  [facility] [address] [place], [place] [address]  Phone [phone] Fax [phone]

## 2022-10-26 NOTE — Patient Instructions (Addendum)
We have a great new class of medications CGRP: Emgality, Ajovy, Qulipta, Vyepti, [place], Nurtec. (Aimovig) But you have to fail at least 3 classes of meds either side effect of have to try 3 months: usually Topiramate or Depakote, Propanolol or other BP medication such as calcium channel blocker,  and also TCA nortriptyline or instead sometimes effexor counts instead of TCA.    Nurtec: Can take it preventatively every other day or as needed. During period week can take it once daily regardless.  Increase Topiramate to 100mg  at bedtime. Next we could try propranolol 10mg  twice daily to 20mg  twice daily. Next we could try Nortriptyline 10mg  at bedtime.   Rimegepant Disintegrating Tablets What is this medication? RIMEGEPANT (ri ME je pant) prevents and treats migraines. It works by blocking a substance in the body that causes migraines. This medicine may be used for other purposes; ask your health care provider or pharmacist if you have questions. COMMON BRAND NAME(S): NURTEC ODT What should I tell my care team before I take this medication? They need to know if you have any of these conditions: Kidney disease Liver disease An unusual or allergic reaction to rimegepant, other medications, foods, dyes, or preservatives Pregnant or trying to get pregnant Breast-feeding How should I use this medication? Take this medication by mouth. Take it as directed on the prescription label. Leave the tablet in the sealed pack until you are ready to take it. With dry hands, open the pack and gently remove the tablet. If the tablet breaks or crumbles, throw it away. Use a new tablet. Place the tablet in the mouth and allow it to dissolve. Then, swallow it. Do not cut, crush, or chew this medication. You do not need water to take this medication. Talk to your care team about the use of this medication in children. Special care may be needed. Overdosage: If you think you have taken too much of this medicine  contact a poison control center or emergency room at once. NOTE: This medicine is only for you. Do not share this medicine with others. What if I miss a dose? This does not apply. This medication is not for regular use. What may interact with this medication? Certain medications for fungal infections, such as fluconazole, itraconazole Rifampin This list may not describe all possible interactions. Give your health care provider a list of all the medicines, herbs, non-prescription drugs, or dietary supplements you use. Also tell them if you smoke, drink alcohol, or use illegal drugs. Some items may interact with your medicine. What should I watch for while using this medication? Visit your care team for regular checks on your progress. Tell your care team if your symptoms do not start to get better or if they get worse. What side effects may I notice from receiving this medication? Side effects that you should report to your care team as soon as possible: Allergic reactions--skin rash, itching, hives, swelling of the face, lips, tongue, or throat Side effects that usually do not require medical attention (report to your care team if they continue or are bothersome): Nausea Stomach pain This list may not describe all possible side effects. Call your doctor for medical advice about side effects. You may report side effects to FDA at 1-800-FDA-1088. Where should I keep my medication? Keep out of the reach of children and pets. Store at room temperature between 20 and 25 degrees C (68 and 77 degrees F). Get rid of any unused medication after the expiration date.  To get rid of medications that are no longer needed or have expired: Take the medication to a medication take-back program. Check with your pharmacy or law enforcement to find a location. If you cannot return the medication, check the label or package insert to see if the medication should be thrown out in the garbage or flushed down the  toilet. If you are not sure, ask your care team. If it is safe to put it in the trash, take the medication out of the container. Mix the medication with cat litter, dirt, coffee grounds, or other unwanted substance. Seal the mixture in a bag or container. Put it in the trash. NOTE: This sheet is a summary. It may not cover all possible information. If you have questions about this medicine, talk to your doctor, pharmacist, or health care provider.  2024 Elsevier/Gold Standard (2021-04-14 00:00:00)

## 2022-11-02 ENCOUNTER — Other Ambulatory Visit (HOSPITAL_COMMUNITY): Payer: Self-pay

## 2022-11-02 ENCOUNTER — Telehealth: Payer: Self-pay

## 2022-11-02 NOTE — Telephone Encounter (Signed)
Please switch her to [place]!

## 2022-11-02 NOTE — Telephone Encounter (Signed)
   Per chart it appears the PT ha snot tried [place] first-Please advise.

## 2022-11-02 NOTE — Telephone Encounter (Signed)
Pharmacy Patient Advocate Encounter   Received notification from RX Request Messages that prior authorization for Nurtec 75MG  dispersible tablets is required/requested.   Insurance verification completed.   The patient is insured through [facility] .   Per test claim: PA required; PA started via CoverMyMeds. KEY [id number] . Waiting for clinical questions to populate.

## 2022-11-04 ENCOUNTER — Telehealth: Payer: Self-pay

## 2022-11-04 ENCOUNTER — Other Ambulatory Visit (HOSPITAL_COMMUNITY): Payer: Self-pay

## 2022-11-04 NOTE — Telephone Encounter (Signed)
I am going to start PA for Ubrelvy-Can you verify what the strength (100mg /50mg ) and QTY please-Thanks.

## 2022-11-08 ENCOUNTER — Other Ambulatory Visit (HOSPITAL_COMMUNITY): Payer: Self-pay

## 2022-11-08 NOTE — Telephone Encounter (Signed)
Pharmacy Patient Advocate Encounter   Received notification from Physician's Office that prior authorization for Ubrelvy 100MG  tablets is required/requested.   Insurance verification completed.   The patient is insured through [facility] .   Per test claim: PA required; PA started via CoverMyMeds. KEY [id number] . Waiting for clinical questions to populate.

## 2022-11-08 NOTE — Telephone Encounter (Signed)
Thanks! It will be 100 mg with quantity 16.

## 2022-11-09 ENCOUNTER — Other Ambulatory Visit (HOSPITAL_COMMUNITY): Payer: Self-pay

## 2022-11-09 NOTE — Telephone Encounter (Signed)
Pharmacy Patient Advocate Encounter  Received notification from [facility] that Prior Authorization for Ubrelvy 100MG  tablets has been APPROVED from 11/09/2022 to 02/01/2023. Ran test claim, Copay is $0 per 30DS/16Tabs. This test claim was processed through [facility]- copay amounts may vary at other pharmacies due to pharmacy/plan contracts, or as the patient moves through the different stages of their insurance plan.   PA #/Case ID/Reference #: PA Case ID #: [id number]

## 2022-11-09 NOTE — Telephone Encounter (Signed)
Clinical questions have been submitted-awaiting determination. 

## 2022-11-10 MED ORDER — UBRELVY 100 MG PO TABS: ORAL_TABLET | ORAL | 5 refills | Status: AC

## 2022-11-10 NOTE — Telephone Encounter (Signed)
I spoke with the patient and discussed that Nurtec is not preferred by insurance but Bernita Raisin is and it has already been approved. She is in agreement to try Ubrelvy instead of Nurtec. She will let us know if it doesn't work. We went over the instructions. Patient verbalized appreciation. Prescription sent to [facility] as preferred by pt.

## 2022-11-10 NOTE — Addendum Note (Signed)
Addended by: Bertram Savin on: 11/10/2022 10:08 AM   Modules accepted: Orders

## 2022-12-30 DIAGNOSIS — Z01419 Encounter for gynecological examination (general) (routine) without abnormal findings: Secondary | ICD-10-CM | POA: Diagnosis not present

## 2023-01-13 DIAGNOSIS — E039 Hypothyroidism, unspecified: Secondary | ICD-10-CM | POA: Diagnosis not present

## 2023-01-13 DIAGNOSIS — E034 Atrophy of thyroid (acquired): Secondary | ICD-10-CM | POA: Diagnosis not present

## 2023-01-13 DIAGNOSIS — E782 Mixed hyperlipidemia: Secondary | ICD-10-CM | POA: Diagnosis not present

## 2023-01-13 DIAGNOSIS — L659 Nonscarring hair loss, unspecified: Secondary | ICD-10-CM | POA: Diagnosis not present

## 2023-01-13 DIAGNOSIS — E1169 Type 2 diabetes mellitus with other specified complication: Secondary | ICD-10-CM | POA: Diagnosis not present

## 2023-01-13 DIAGNOSIS — E785 Hyperlipidemia, unspecified: Secondary | ICD-10-CM | POA: Diagnosis not present

## 2023-01-18 ENCOUNTER — Encounter: Payer: Self-pay | Admitting: Allergy and Immunology

## 2023-01-18 ENCOUNTER — Ambulatory Visit: Payer: BC Managed Care – PPO | Admitting: Allergy and Immunology

## 2023-01-18 VITALS — BP 130/82 | HR 75 | Resp 16

## 2023-01-18 DIAGNOSIS — H1010 Acute atopic conjunctivitis, unspecified eye: Secondary | ICD-10-CM | POA: Diagnosis not present

## 2023-01-18 DIAGNOSIS — T781XXD Other adverse food reactions, not elsewhere classified, subsequent encounter: Secondary | ICD-10-CM

## 2023-01-18 DIAGNOSIS — J301 Allergic rhinitis due to pollen: Secondary | ICD-10-CM

## 2023-01-18 DIAGNOSIS — J4541 Moderate persistent asthma with (acute) exacerbation: Secondary | ICD-10-CM | POA: Diagnosis not present

## 2023-01-18 DIAGNOSIS — J3089 Other allergic rhinitis: Secondary | ICD-10-CM | POA: Diagnosis not present

## 2023-01-18 DIAGNOSIS — J157 Pneumonia due to Mycoplasma pneumoniae: Secondary | ICD-10-CM

## 2023-01-18 DIAGNOSIS — K219 Gastro-esophageal reflux disease without esophagitis: Secondary | ICD-10-CM

## 2023-01-18 MED ORDER — AZITHROMYCIN 500 MG PO TABS: 500.0000 mg | ORAL_TABLET | Freq: Every day | ORAL | 0 refills | Status: AC

## 2023-01-18 MED ORDER — OMEPRAZOLE 40 MG PO CPDR: 40.0000 mg | DELAYED_RELEASE_CAPSULE | Freq: Every day | ORAL | 1 refills | Status: DC

## 2023-01-18 MED ORDER — METHYLPREDNISOLONE ACETATE 80 MG/ML IJ SUSP
80.0000 mg | Freq: Once | INTRAMUSCULAR | Status: AC
  Administered 2023-01-18: 80 mg via INTRAMUSCULAR

## 2023-01-18 MED ORDER — FLUTICASONE PROPIONATE 50 MCG/ACT NA SUSP: NASAL | 5 refills | Status: AC

## 2023-01-18 NOTE — Patient Instructions (Signed)
  1.  Perform allergen avoidance measures as best as possible. Sports glasses while outdoors during spring  2.  Treat and prevent inflammation:  A. Symbicort 160  - 2 inhalation 1-2 times per day w/spacer   B. Fluticasone - 2 sprays each nostril 3-7 times per week  3. Treat and prevent reflux / LPR:   A. Avoid caffeine and chocolate  4.  If needed:   A. AirSupra - 2 inhalations every 4-6 hours (Coupon)  B. Xyzal 5 mg - 1 tablet 1-2 times per day  C. Pataday - 1 drop each eye 1 time per day  D. Auvi-Q 0.3, benadryl, MD/ER evaluation for allergic reaction  5. For this recent event:   A. Depomedrol 80 IM delivered in clinic today  B. Azithromycin 500 mg - 1 tablet 1 time per day for 3 days  C. Use Symbicort, Airsupra D. Treat reflux with omeprazole 40 mg - 1 tablet 1 time per day  E. Can add OTC Mucinex DM if needed  5. Return to clinic in 6 months or earlier if problem.

## 2023-01-18 NOTE — Progress Notes (Unsigned)
[facility] [place] [facility] [place]   Follow-up Note  Referring Provider: Doran Stabler, * Primary Provider: Doran Stabler, NP Date of Office Visit: 01/18/2023  Subjective:   Quetzally Mariscal (DOB: [birth year]) is a 37 y.o. female who [facility] in re-evaluation of the following:  HPI: Tamika returns to this clinic in evaluation of asthma, allergic rhinitis, oral allergy syndrome, migraine headache.  I last saw her in this clinic 06 Jul 2022.  She felt that she was doing wonderful regarding her airway and she tapered off her Symbicort and had no need to use any Airsupra and could exercise without any problem manage did not require systemic steroid to treat a exacerbation of asthma.  Likewise, she did not use any nasal steroid and she did not require an antibiotic to treat an episode of sinusitis.  And her reflux was really doing very well while she consolidated her caffeine and she did not require any therapy for that issue.  However, about 10 to 14 days ago she developed cough and some nasal congestion without any anosmia or ugly nasal discharge or phlegm production or chest pain or associated systemic or constitutional symptoms including fever and this has not resolved.  Her kids are sick at home with "walking pneumonia".  All of those children had swabs that did not identify any COVID or flu.  When we last saw her in this clinic we referred her to neurology for her migraines and she is doing much better regarding that issue with a change in her medical plan.  Allergies as of 01/18/2023       Reactions   Lisinopril Cough   Other    Apples and peaches   Sulfa Antibiotics         Medication List    Airsupra 90-80 MCG/ACT Aero Generic drug: Albuterol-Budesonide TAKE 2 PUFFS BY MOUTH EVERY 4 HOURS AS NEEDED   albuterol 108 (90 Base) MCG/ACT inhaler Commonly known as: VENTOLIN HFA Inhale into the  lungs every 6 (six) hours as needed for wheezing or shortness of breath.   albuterol (5 MG/ML) 0.5% nebulizer solution Commonly known as: PROVENTIL Take 2.5 mg by nebulization as needed for wheezing or shortness of breath.   Auvi-Q 0.3 MG/0.3ML Soaj injection Generic drug: EPINEPHrine Use as directed for life-threatening allergic reaction.   budesonide-formoterol 160-4.5 MCG/ACT inhaler Commonly known as: Symbicort Inhale 2 puffs into the lungs 2 (two) times daily.   levocetirizine 5 MG tablet Commonly known as: XYZAL Take 5 mg by mouth every evening.   levothyroxine 75 MCG tablet Commonly known as: SYNTHROID Take 75 mcg by mouth daily.   metFORMIN 500 MG 24 hr tablet Commonly known as: GLUCOPHAGE-XR Take 500 mg by mouth 2 (two) times daily.   Ozempic (2 MG/DOSE) 8 MG/3ML Sopn Generic drug: Semaglutide (2 MG/DOSE) Inject 2 mg into the skin once a week.   SUMAtriptan 100 MG tablet Commonly known as: IMITREX SMARTSIG:0.5-1 Tablet(s) By Mouth 1-2 Times Daily PRN   topiramate 100 MG tablet Commonly known as: TOPAMAX Take 1 tablet (100 mg total) by mouth at bedtime.   Ubrelvy 100 MG Tabs Generic drug: Ubrogepant Take 1 tablet (100 mg) at onset of  migraine. May repeat in 2 hours if needed. Do not exceed 2 tablets (200 mg) in 24 hours.    Past Medical History:  Diagnosis Date   Asthma    Hemochromatosis carrier    Hypothyroid  PCOS (polycystic ovarian syndrome)    Pre-diabetes     Past Surgical History:  Procedure Laterality Date   CESAREAN SECTION  2016   CESAREAN SECTION  2019   WISDOM TOOTH EXTRACTION      Review of systems negative except as noted in HPI / PMHx or noted below:  Review of Systems  Constitutional: Negative.   HENT: Negative.    Eyes: Negative.   Respiratory: Negative.    Cardiovascular: Negative.   Gastrointestinal: Negative.   Genitourinary: Negative.   Musculoskeletal: Negative.   Skin: Negative.   Neurological: Negative.    Endo/Heme/Allergies: Negative.   Psychiatric/Behavioral: Negative.       Objective:   Vitals:   01/18/23 0817  BP: 130/82  Pulse: 75  Resp: 16  SpO2: 99%          Physical Exam Constitutional:      Appearance: She is not diaphoretic.  HENT:     Head: Normocephalic.     Right Ear: Tympanic membrane, ear canal and external ear normal.     Left Ear: Tympanic membrane, ear canal and external ear normal.     Nose: Nose normal. No mucosal edema or rhinorrhea.     Mouth/Throat:     Pharynx: Uvula midline. No oropharyngeal exudate.  Eyes:     Conjunctiva/sclera: Conjunctivae normal.  Neck:     Thyroid: No thyromegaly.     Trachea: Trachea normal. No tracheal tenderness or tracheal deviation.  Cardiovascular:     Rate and Rhythm: Normal rate and regular rhythm.     Heart sounds: Normal heart sounds, S1 normal and S2 normal. No murmur heard. Pulmonary:     Effort: No respiratory distress.     Breath sounds: Normal breath sounds. No stridor. No wheezing (faint inspiratory crackles left posterior midlung field) or rales.  Lymphadenopathy:     Head:     Right side of head: No tonsillar adenopathy.     Left side of head: No tonsillar adenopathy.     Cervical: No cervical adenopathy.  Skin:    Findings: No erythema or rash.     Nails: There is no clubbing.  Neurological:     Mental Status: She is alert.     Diagnostics: Spirometry was performed and demonstrated an FEV1 of 3.44 at 95 % of predicted.  Assessment and Plan:   1. Asthma, not well controlled, moderate persistent, with acute exacerbation   2. Perennial allergic rhinitis   3. Seasonal allergic rhinitis due to pollen   4. Seasonal allergic conjunctivitis   5. Pollen-food allergy, subsequent encounter   6. Gastroesophageal reflux disease, unspecified whether esophagitis present   7. Pneumonia of left lower lobe due to Mycoplasma pneumoniae    1.  Perform allergen avoidance measures as best as possible. Sports  glasses while outdoors during spring  2.  Treat and prevent inflammation:  A. Symbicort 160  - 2 inhalation 1-2 times per day w/spacer   B. Fluticasone - 2 sprays each nostril 3-7 times per week  3. Treat and prevent reflux / LPR:   A. Avoid caffeine and chocolate  4.  If needed:   A. AirSupra - 2 inhalations every 4-6 hours (Coupon)  B. Xyzal 5 mg - 1 tablet 1-2 times per day  C. Pataday - 1 drop each eye 1 time per day  D. Auvi-Q 0.3, benadryl, MD/ER evaluation for allergic reaction  5. For this recent event:   A. Depomedrol 80 IM delivered in clinic today  B. Azithromycin 500 mg - 1 tablet 1 time per day for 3 days  C. Use Symbicort, Airsupra D. Treat reflux with omeprazole 40 mg - 1 tablet 1 time per day  E. Can add OTC Mucinex DM if needed  5. Return to clinic in 6 months or earlier if problem.  I suspect that Johnye has a mycoplasma infection especially given the fact that her children have this type of infection and we will treat her with a macrolide and give her an anti-inflammatory steroid injection and given her history of reflux we will have her start omeprazole during this event.  She was actually doing quite well prior to this infection and did not have any significant issues revolving around her asthma or her upper airway issue or her reflux while intermittently using medical therapy.  She has a good understanding of her medications and how they work and appropriate dosing of her medications depending on disease activity.  If she does well I will see her back in this clinic in 6 months or earlier if there is a problem.  [name], MD Allergy / Immunology [facility]

## 2023-01-19 ENCOUNTER — Encounter: Payer: Self-pay | Admitting: Allergy and Immunology

## 2023-02-01 ENCOUNTER — Telehealth: Payer: Self-pay

## 2023-02-01 NOTE — Telephone Encounter (Signed)
*  [facility]  Pharmacy Patient Advocate Encounter   Received notification from CoverMyMeds that prior authorization for Ubrelvy 100MG  tablets  is required/requested.   Insurance verification completed.   The patient is insured through [facility] .   Per test claim: PA required; PA submitted to above mentioned insurance via CoverMyMeds Key/confirmation #/EOC [facility] Status is pending

## 2023-02-06 ENCOUNTER — Other Ambulatory Visit (HOSPITAL_COMMUNITY): Payer: Self-pay

## 2023-02-06 NOTE — Telephone Encounter (Signed)
Pharmacy Patient Advocate Encounter  Received notification from [facility] that Prior Authorization for Ubrelvy 100MG  tablets has been APPROVED from 02/04/2023 to 02/01/2024. Ran test claim, Copay is $0. This test claim was processed through [facility]- copay amounts may vary at other pharmacies due to pharmacy/plan contracts, or as the patient moves through the different stages of their insurance plan.   PA #/Case ID/Reference #: PA Case ID #: [id number]

## 2023-02-09 ENCOUNTER — Other Ambulatory Visit: Payer: Self-pay | Admitting: Allergy and Immunology

## 2023-04-09 ENCOUNTER — Encounter (HOSPITAL_BASED_OUTPATIENT_CLINIC_OR_DEPARTMENT_OTHER): Payer: Self-pay

## 2023-04-09 ENCOUNTER — Ambulatory Visit (HOSPITAL_BASED_OUTPATIENT_CLINIC_OR_DEPARTMENT_OTHER)
Admit: 2023-04-09 | Discharge: 2023-04-09 | Disposition: A | Discharge status: Home / Self Care | Payer: BC Managed Care – PPO | Attending: Family Medicine | Admitting: Radiology

## 2023-04-09 ENCOUNTER — Ambulatory Visit (HOSPITAL_BASED_OUTPATIENT_CLINIC_OR_DEPARTMENT_OTHER)
Admission: RE | Admit: 2023-04-09 | Discharge: 2023-04-09 | Disposition: A | Discharge status: Home / Self Care | Payer: BC Managed Care – PPO | Source: Ambulatory Visit | Attending: Family Medicine | Admitting: Family Medicine

## 2023-04-09 VITALS — BP 139/84 | HR 124 | Temp 99.8°F | Resp 19

## 2023-04-09 DIAGNOSIS — R051 Acute cough: Secondary | ICD-10-CM

## 2023-04-09 DIAGNOSIS — J111 Influenza due to unidentified influenza virus with other respiratory manifestations: Secondary | ICD-10-CM | POA: Diagnosis not present

## 2023-04-09 DIAGNOSIS — J4541 Moderate persistent asthma with (acute) exacerbation: Secondary | ICD-10-CM

## 2023-04-09 DIAGNOSIS — R059 Cough, unspecified: Secondary | ICD-10-CM | POA: Diagnosis not present

## 2023-04-09 MED ORDER — PREDNISONE 20 MG PO TABS
40.0000 mg | ORAL_TABLET | Freq: Every day | ORAL | 0 refills | Status: AC
Start: 2023-04-09 — End: 2023-04-14

## 2023-04-09 MED ORDER — XOFLUZA (80 MG DOSE) 1 X 80 MG PO TBPK: 80.0000 mg | ORAL_TABLET | Freq: Once | ORAL | 0 refills | Status: AC

## 2023-04-09 NOTE — ED Triage Notes (Signed)
Sx started Friday night Son has Flu A. She's took at home flu test that have all been negative   Sneezing Cough Fever Nasal congestion Bodyaches headache

## 2023-04-09 NOTE — Discharge Instructions (Signed)
The chest x-ray is negative for pneumonia  Take prednisone 20 mg--2 daily for 5 days; this is for inflammation in your lungs.  It will not make your sugars go up some.  Make sure you are eating a good diet and drinking plenty of fluids.  Xofluza 40 mg--take 2 together once.  This is an antiviral antiflu medication.  If you end up not wanting to take this, that is also all right.

## 2023-04-09 NOTE — ED Provider Notes (Signed)
[facility]    CSN: [id number] Arrival date & time: 04/09/23  1357      History   Chief Complaint Chief Complaint  Patient presents with   Cough    Entered by patient   Fever   Nasal Congestion   Headache   Generalized Body Aches   Back Pain    HPI Melinda Mccarthy is a 38 y.o. female.    Cough Associated symptoms: fever and headaches   Fever Associated symptoms: cough and headaches   Headache Associated symptoms: back pain, cough and fever   Back Pain Associated symptoms: fever and headaches   Here for fever and cough and headache and nasal congestion and rhinorrhea.  Symptoms began on the evening of January 31.  She has done about 4 home flu and COVID tests and they have all been negative.  The last when she did this morning.  Her son tested positive for the flu on January 31.  She does have a history of diabetes and her sugars are doing fairly well lately.    She has maybe had some side effects from Tamiflu when she took it a long time ago.  She is allergic to lisinopril and sulfa  Last menstrual cycle began on January 22.  Past Medical History:  Diagnosis Date   Asthma    Hemochromatosis carrier    Hypothyroid    PCOS (polycystic ovarian syndrome)    Pre-diabetes     Patient Active Problem List   Diagnosis Date Noted   Gastroesophageal reflux disease 12/13/2019   Seasonal and perennial allergic rhinitis 07/12/2019   Allergic conjunctivitis of both eyes 07/12/2019   Moderate persistent asthma without complication 07/12/2019   Pollen-food allergy 07/12/2019   Chronic sinusitis 07/12/2019   Vaccine counseling 07/12/2019   [redacted] weeks gestation of pregnancy    Excessive fetal growth affecting management of mother, antepartum    Diet controlled gestational diabetes mellitus in third trimester    [redacted] weeks gestation of pregnancy    Abnormal first trimester screen    Encounter for fetal anatomic survey    [redacted] weeks gestation of pregnancy     Abnormal findings on antenatal screening of mother 02/26/2014   [redacted] weeks gestation of pregnancy 02/26/2014    Past Surgical History:  Procedure Laterality Date   CESAREAN SECTION  2016   CESAREAN SECTION  2019   WISDOM TOOTH EXTRACTION      OB History     Gravida  1   Para  0   Term  0   Preterm  0   AB  0   Living  0      SAB  0   IAB  0   Ectopic  0   Multiple  0   Live Births               Home Medications    Prior to Admission medications   Medication Sig Start Date End Date Taking? Authorizing Provider  Baloxavir Marboxil,80 MG Dose, (XOFLUZA, 80 MG DOSE,) 1 x 80 MG TBPK Take 80 mg by mouth once for 1 dose. 04/09/23 04/09/23 Yes Zenia Resides, MD  levothyroxine (SYNTHROID) 75 MCG tablet Take 75 mcg by mouth daily. 07/02/20  Yes [provider]  metFORMIN (GLUCOPHAGE-XR) 500 MG 24 hr tablet Take 500 mg by mouth 2 (two) times daily. 12/06/20  Yes [provider]  OZEMPIC, 2 MG/DOSE, 8 MG/3ML SOPN Inject 2 mg into the skin once a week. 05/17/22  Yes [provider]  predniSONE (DELTASONE) 20 MG tablet Take 2 tablets (40 mg total) by mouth daily with breakfast for 5 days. 04/09/23 04/14/23 Yes Zenia Resides, MD  topiramate (TOPAMAX) 100 MG tablet Take 1 tablet (100 mg total) by mouth at bedtime. 10/26/22  Yes Anson Fret, MD  albuterol (PROVENTIL) (5 MG/ML) 0.5% nebulizer solution Take 2.5 mg by nebulization as needed for wheezing or shortness of breath.    [provider]  albuterol (VENTOLIN HFA) 108 (90 Base) MCG/ACT inhaler Inhale into the lungs every 6 (six) hours as needed for wheezing or shortness of breath.    [provider]  Albuterol-Budesonide (AIRSUPRA) 90-80 MCG/ACT AERO TAKE 2 PUFFS BY MOUTH EVERY 4 HOURS AS NEEDED 06/14/22   Kozlow, Alvira Philips, MD  AUVI-Q 0.3 MG/0.3ML SOAJ injection Use as directed for life-threatening allergic reaction. 07/08/20   Kozlow, Alvira Philips, MD  budesonide-formoterol (SYMBICORT)  160-4.5 MCG/ACT inhaler Inhale 2 puffs into the lungs 2 (two) times daily. 06/09/22   Kozlow, Alvira Philips, MD  fluticasone (FLONASE) 50 MCG/ACT nasal spray 2 sprays in each nostril 3-7 times per week 01/18/23   Kozlow, Alvira Philips, MD  levocetirizine (XYZAL) 5 MG tablet Take 5 mg by mouth every evening.    [provider]  omeprazole (PRILOSEC) 40 MG capsule TAKE 1 CAPSULE (40 MG TOTAL) BY MOUTH DAILY. 02/09/23   Kozlow, Alvira Philips, MD  SUMAtriptan (IMITREX) 100 MG tablet SMARTSIG:0.5-1 Tablet(s) By Mouth 1-2 Times Daily PRN 05/13/22   [provider]  Ubrogepant (UBRELVY) 100 MG TABS Take 1 tablet (100 mg) at onset of  migraine. May repeat in 2 hours if needed. Do not exceed 2 tablets (200 mg) in 24 hours. 11/10/22   Anson Fret, MD    Family History Family History  Problem Relation Age of Onset   Hypertension Mother    Hypothyroidism Mother    Bladder Cancer Maternal Grandfather    Breast cancer Paternal Grandmother    Diabetes Paternal Grandfather    Migraines Neg Hx     Social History Social History   Tobacco Use   Smoking status: Never   Smokeless tobacco: Never  Vaping Use   Vaping status: Never Used  Substance Use Topics   Alcohol use: Yes   Drug use: No     Allergies   Lisinopril, Other, and Sulfa antibiotics   Review of Systems Review of Systems  Constitutional:  Positive for fever.  Respiratory:  Positive for cough.   Musculoskeletal:  Positive for back pain.  Neurological:  Positive for headaches.     Physical Exam Triage Vital Signs ED Triage Vitals  Encounter Vitals Group     BP 04/09/23 1439 139/84     Systolic BP Percentile --      Diastolic BP Percentile --      Pulse Rate 04/09/23 1439 (!) 124     Resp 04/09/23 1439 19     Temp 04/09/23 1439 99.8 F (37.7 C)     Temp Source 04/09/23 1439 Oral     SpO2 04/09/23 1439 99 %     Weight --      Height --      Head Circumference --      Peak Flow --      Pain Score 04/09/23 1438 7     Pain  Loc --      Pain Education --      Exclude from Growth Chart --    No  data found.  Updated Vital Signs BP 139/84 (BP Location: Right Arm)   Pulse (!) 124   Temp 99.8 F (37.7 C) (Oral)   Resp 19   LMP 03/29/2023 (Approximate)   SpO2 99%   Visual Acuity Right Eye Distance:   Left Eye Distance:   Bilateral Distance:    Right Eye Near:   Left Eye Near:    Bilateral Near:     Physical Exam   UC Treatments / Results  Labs (all labs ordered are listed, but only abnormal results are displayed) Labs Reviewed - No data to display  EKG   Radiology DG Chest 2 View Result Date: 04/09/2023 CLINICAL DATA:  Cough EXAM: CHEST - 2 VIEW COMPARISON:  03/29/2019 FINDINGS: The heart size and mediastinal contours are within normal limits. No focal airspace consolidation, pleural effusion, or pneumothorax. The visualized skeletal structures are unremarkable. IMPRESSION: No active cardiopulmonary disease. Electronically Signed   By: Duanne Guess D.O.   On: 04/09/2023 15:27    Procedures Procedures (including critical care time)  Medications Ordered in UC Medications - No data to display  Initial Impression / Assessment and Plan / UC Course  I have reviewed the triage vital signs and the nursing notes.  Pertinent labs & imaging results that were available during my care of the patient were reviewed by me and considered in my medical decision making (see chart for details).      Chest x-ray is negative.  She states she is very hesitant to take the Tamiflu.  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  Melinda Mccarthy is sent in to treat the influenza-like illness.  I did not repeat her test here as she is already done for.  She also understands that if she does not want to take the [place] she does  not have to.  She already has medication for her nebulizer and inhalers.  She takes Symbicort daily.  Prednisone is sent in to treat the asthma exacerbation.   Final Clinical Impressions(s) / UC Diagnoses   Final diagnoses:  Acute cough  Influenza-like illness  Moderate persistent asthma with acute exacerbation     Discharge Instructions      The chest x-ray is negative for pneumonia  Take prednisone 20 mg--2 daily for 5 days; this is for inflammation in your lungs.  It will not make your sugars go up some.  Make sure you are eating a good diet and drinking plenty of fluids.  Xofluza 40 mg--take 2 together once.  This is an antiviral antiflu medication.  If you end up not wanting to take this, that is also all right.     ED Prescriptions     Medication Sig Dispense Auth. Provider   Baloxavir Marboxil,80 MG Dose, (XOFLUZA, 80 MG DOSE,) 1 x 80 MG TBPK Take 80 mg by mouth once for 1 dose. 2 each Zenia Resides, MD   predniSONE (DELTASONE) 20 MG tablet Take 2 tablets (40 mg total) by mouth daily with breakfast for 5 days. 10 tablet Marlinda Mike Janace Aris, MD      PDMP not reviewed this encounter.   Zenia Resides, MD 04/09/23 [phone]

## 2023-04-21 DIAGNOSIS — R059 Cough, unspecified: Secondary | ICD-10-CM | POA: Diagnosis not present

## 2023-04-21 DIAGNOSIS — B370 Candidal stomatitis: Secondary | ICD-10-CM | POA: Diagnosis not present

## 2023-04-21 DIAGNOSIS — J180 Bronchopneumonia, unspecified organism: Secondary | ICD-10-CM | POA: Diagnosis not present

## 2023-05-26 DIAGNOSIS — E1169 Type 2 diabetes mellitus with other specified complication: Secondary | ICD-10-CM | POA: Diagnosis not present

## 2023-05-26 DIAGNOSIS — E039 Hypothyroidism, unspecified: Secondary | ICD-10-CM | POA: Diagnosis not present

## 2023-05-26 DIAGNOSIS — E782 Mixed hyperlipidemia: Secondary | ICD-10-CM | POA: Diagnosis not present

## 2023-05-26 DIAGNOSIS — E785 Hyperlipidemia, unspecified: Secondary | ICD-10-CM | POA: Diagnosis not present

## 2023-06-15 ENCOUNTER — Other Ambulatory Visit: Payer: Self-pay | Admitting: Allergy and Immunology

## 2023-07-03 DIAGNOSIS — J029 Acute pharyngitis, unspecified: Secondary | ICD-10-CM | POA: Diagnosis not present

## 2023-07-19 ENCOUNTER — Encounter: Payer: Self-pay | Admitting: Allergy and Immunology

## 2023-07-19 ENCOUNTER — Ambulatory Visit: Payer: BC Managed Care – PPO | Admitting: Allergy and Immunology

## 2023-07-19 VITALS — BP 108/68 | HR 80 | Resp 14 | Ht 68.5 in | Wt 219.0 lb

## 2023-07-19 DIAGNOSIS — J4540 Moderate persistent asthma, uncomplicated: Secondary | ICD-10-CM | POA: Diagnosis not present

## 2023-07-19 DIAGNOSIS — H1013 Acute atopic conjunctivitis, bilateral: Secondary | ICD-10-CM

## 2023-07-19 DIAGNOSIS — T781XXD Other adverse food reactions, not elsewhere classified, subsequent encounter: Secondary | ICD-10-CM

## 2023-07-19 DIAGNOSIS — J3089 Other allergic rhinitis: Secondary | ICD-10-CM | POA: Diagnosis not present

## 2023-07-19 DIAGNOSIS — K219 Gastro-esophageal reflux disease without esophagitis: Secondary | ICD-10-CM

## 2023-07-19 DIAGNOSIS — H1010 Acute atopic conjunctivitis, unspecified eye: Secondary | ICD-10-CM

## 2023-07-19 DIAGNOSIS — J301 Allergic rhinitis due to pollen: Secondary | ICD-10-CM | POA: Diagnosis not present

## 2023-07-19 MED ORDER — AUVI-Q 0.3 MG/0.3ML IJ SOAJ: INTRAMUSCULAR | 3 refills | Status: AC

## 2023-07-19 NOTE — Patient Instructions (Addendum)
  1.  Perform allergen avoidance measures as best as possible. Sports glasses while outdoors during spring  2.  Treat and prevent reflux / LPR:   A. Avoid caffeine and chocolate  3.  If needed:   A. AirSupra  - 2 inhalations every 4-6 hours (Coupon)  B. Xyzal 5 mg - 1 tablet 1-2 times per day  C. Pataday  - 1 drop each eye 1 time per day  D. Auvi-Q  0.3, benadryl, MD/ER evaluation for allergic reaction  4. Influenza = Tamiflu. Covid = Paxlovid  5. Return to clinic in 12 months or earlier if problem.

## 2023-07-19 NOTE — Progress Notes (Unsigned)
 [place] - [place] - [place] - [place] - [place]   Follow-up Note  Referring Provider: Arvilla Birmingham, * Primary Provider: Olga Berthold, NP Date of Office Visit: 07/19/2023  Subjective:   Melinda Mccarthy (DOB: 1985-08-10) is a 38 y.o. female who [facility] in re-evaluation of the following:  HPI: [name] returns to this clinic in evaluation of asthma, allergic rhinitis, oral allergy  syndrome.  I last saw her in this clinic 18 January 2023.  She contracted flu at the end of January 2025 and was pretty sick for a while but fortunately resolved that issue.  Interestingly, this spring has not been particularly bad for her regarding control of her asthma and her upper airway issues.  She is not using any Symbicort  and she is not using any Flonase  and she has no need to use any short acting bronchodilator and the only medicine she is using on a pretty consistent basis is Xyzal and Pataday .  She still remains away from specific foods that give rise to oral itching.  She does have an injectable epinephrine  device.  She has not had any issues with reflux.  Allergies as of 07/19/2023       Reactions   Lisinopril Cough   Other    Apples and peaches   Sulfa Antibiotics         Medication List    Airsupra  90-80 MCG/ACT Aero Generic drug: Albuterol-Budesonide  TAKE 2 PUFFS BY MOUTH EVERY 4 HOURS AS NEEDED   albuterol (5 MG/ML) 0.5% nebulizer solution Commonly known as: PROVENTIL Take 2.5 mg by nebulization as needed for wheezing or shortness of breath.   Auvi-Q  0.3 MG/0.3ML Soaj injection Generic drug: EPINEPHrine  Use as directed for life-threatening allergic reaction.   budesonide -formoterol  160-4.5 MCG/ACT inhaler Commonly known as: SYMBICORT  INHALE 2 PUFFS INTO THE LUNGS TWICE A DAY   fluticasone  50 MCG/ACT nasal spray Commonly known as: FLONASE  2 sprays in each nostril 3-7 times per week   levocetirizine 5 MG  tablet Commonly known as: XYZAL Take 5 mg by mouth every evening.   levothyroxine 75 MCG tablet Commonly known as: SYNTHROID Take 75 mcg by mouth daily.   metFORMIN 500 MG tablet Commonly known as: GLUCOPHAGE Take by mouth daily.   Ozempic (2 MG/DOSE) 8 MG/3ML Sopn Generic drug: Semaglutide (2 MG/DOSE) Inject 2 mg into the skin once a week.   SUMAtriptan 100 MG tablet Commonly known as: IMITREX SMARTSIG:0.5-1 Tablet(s) By Mouth 1-2 Times Daily PRN   topiramate  100 MG tablet Commonly known as: TOPAMAX  Take 1 tablet (100 mg total) by mouth at bedtime.   Ubrelvy  100 MG Tabs Generic drug: Ubrogepant  Take 1 tablet (100 mg) at onset of  migraine. May repeat in 2 hours if needed. Do not exceed 2 tablets (200 mg) in 24 hours.    Past Medical History:  Diagnosis Date   Asthma    Hemochromatosis carrier    Hypothyroid    PCOS (polycystic ovarian syndrome)    Pre-diabetes     Past Surgical History:  Procedure Laterality Date   CESAREAN SECTION  2016   CESAREAN SECTION  2019   WISDOM TOOTH EXTRACTION      Review of systems negative except as noted in HPI / PMHx or noted below:  Review of Systems  Constitutional: Negative.   HENT: Negative.    Eyes: Negative.   Respiratory: Negative.    Cardiovascular: Negative.   Gastrointestinal: Negative.   Genitourinary: Negative.  Musculoskeletal: Negative.   Skin: Negative.   Neurological: Negative.   Endo/Heme/Allergies: Negative.   Psychiatric/Behavioral: Negative.       Objective:   Vitals:   07/19/23 0823  BP: 108/68  Pulse: 80  Resp: 14  SpO2: 99%   Height: 5' 8.5" (174 cm)  Weight: 219 lb (99.3 kg)   Physical Exam Constitutional:      Appearance: She is not diaphoretic.  HENT:     Head: Normocephalic.     Right Ear: Tympanic membrane, ear canal and external ear normal.     Left Ear: Tympanic membrane, ear canal and external ear normal.     Nose: Nose normal. No mucosal edema or rhinorrhea.      Mouth/Throat:     Pharynx: Uvula midline. No oropharyngeal exudate.  Eyes:     Conjunctiva/sclera: Conjunctivae normal.  Neck:     Thyroid : No thyromegaly.     Trachea: Trachea normal. No tracheal tenderness or tracheal deviation.  Cardiovascular:     Rate and Rhythm: Normal rate and regular rhythm.     Heart sounds: Normal heart sounds, S1 normal and S2 normal. No murmur heard. Pulmonary:     Effort: No respiratory distress.     Breath sounds: Normal breath sounds. No stridor. No wheezing or rales.  Lymphadenopathy:     Head:     Right side of head: No tonsillar adenopathy.     Left side of head: No tonsillar adenopathy.     Cervical: No cervical adenopathy.  Skin:    Findings: No erythema or rash.     Nails: There is no clubbing.  Neurological:     Mental Status: She is alert.     Diagnostics: Spirometry was performed and demonstrated an FEV1 of 3.39 at 95 % of predicted.  Assessment and Plan:   1. Asthma, moderate persistent, well-controlled   2. Perennial allergic rhinitis   3. Seasonal allergic rhinitis due to pollen   4. Seasonal allergic conjunctivitis   5. Pollen-food allergy , subsequent encounter   6. Gastroesophageal reflux disease, unspecified whether esophagitis present    1.  Perform allergen avoidance measures as best as possible. Sports glasses while outdoors during spring  2.  Treat and prevent reflux / LPR:   A. Avoid caffeine and chocolate  3.  If needed:   A. AirSupra  - 2 inhalations every 4-6 hours (Coupon)  B. Xyzal 5 mg - 1 tablet 1-2 times per day  C. Pataday  - 1 drop each eye 1 time per day  D. Auvi-Q  0.3, benadryl, MD/ER evaluation for allergic reaction  4. Influenza = Tamiflu. Covid = Paxlovid  5. Return to clinic in 12 months or earlier if problem.  [name]'s immune system has changed and she is having much less problem as she is has exposure to springtime pollens regarding her airway issues and she has eliminated most of her  preventative medications utilized for her atopic disease and she still doing great.  We will not have her restart these medications at this point in time and she can use a selection of medications as needed as noted above.  It is quite possible that her rather significant influenza infection that occurred in January 2025 changed her immune system and hopefully that is the case.  If she does well with this plan I will see her back in this clinic in 1 year or earlier if there is a problem.  Schuyler Custard, MD Allergy  / Immunology [facility]

## 2023-07-20 ENCOUNTER — Encounter: Payer: Self-pay | Admitting: Allergy and Immunology

## 2023-10-27 DIAGNOSIS — E119 Type 2 diabetes mellitus without complications: Secondary | ICD-10-CM | POA: Diagnosis not present

## 2023-11-11 ENCOUNTER — Other Ambulatory Visit: Payer: Self-pay | Admitting: Neurology

## 2023-11-11 DIAGNOSIS — G43009 Migraine without aura, not intractable, without status migrainosus: Secondary | ICD-10-CM

## 2023-12-01 ENCOUNTER — Other Ambulatory Visit: Payer: Self-pay | Admitting: Allergy and Immunology

## 2023-12-01 DIAGNOSIS — E782 Mixed hyperlipidemia: Secondary | ICD-10-CM | POA: Diagnosis not present

## 2023-12-01 DIAGNOSIS — E039 Hypothyroidism, unspecified: Secondary | ICD-10-CM | POA: Diagnosis not present

## 2023-12-01 DIAGNOSIS — E1169 Type 2 diabetes mellitus with other specified complication: Secondary | ICD-10-CM | POA: Diagnosis not present

## 2023-12-01 DIAGNOSIS — E785 Hyperlipidemia, unspecified: Secondary | ICD-10-CM | POA: Diagnosis not present

## 2023-12-18 ENCOUNTER — Other Ambulatory Visit: Payer: Self-pay | Admitting: *Deleted

## 2023-12-18 DIAGNOSIS — G43009 Migraine without aura, not intractable, without status migrainosus: Secondary | ICD-10-CM

## 2023-12-18 MED ORDER — TOPIRAMATE 100 MG PO TABS: 100.0000 mg | ORAL_TABLET | Freq: Every day | ORAL | 0 refills | Status: AC

## 2023-12-18 NOTE — Telephone Encounter (Signed)
 Received another Rx request for Topiramate  from [facility]. When Rx last sent to pharmacy, note was included to have patient schedule an appt for further refills. Patient needs reassignment to new MD now if patient would like to continue care here.

## 2023-12-31 ENCOUNTER — Other Ambulatory Visit: Payer: Self-pay | Admitting: Allergy and Immunology

## 2024-02-12 ENCOUNTER — Telehealth: Payer: Self-pay

## 2024-02-12 NOTE — Telephone Encounter (Signed)
 Pharmacy Patient Advocate Encounter   Received notification from CoverMyMeds that prior authorization for Ubrelvy  is due for renewal.   Insurance verification completed.   The patient is insured through [facility].  Action: Patient hasn't been seen in your office in over a year. Plan requires updated chart notes for PA renewal.

## 2024-02-16 DIAGNOSIS — Z6833 Body mass index (BMI) 33.0-33.9, adult: Secondary | ICD-10-CM | POA: Diagnosis not present

## 2024-02-16 DIAGNOSIS — J0100 Acute maxillary sinusitis, unspecified: Secondary | ICD-10-CM | POA: Diagnosis not present

## 2024-02-16 DIAGNOSIS — J4530 Mild persistent asthma, uncomplicated: Secondary | ICD-10-CM | POA: Diagnosis not present

## 2024-02-17 DIAGNOSIS — S61411A Laceration without foreign body of right hand, initial encounter: Secondary | ICD-10-CM | POA: Diagnosis not present

## 2024-07-17 ENCOUNTER — Ambulatory Visit: Admitting: Allergy and Immunology
